# Patient Record
Sex: Female | Born: 1950 | Race: White | Hispanic: No | Marital: Married | Smoking: Never smoker
Health system: Southern US, Community
[De-identification: ages and names within clinical notes are randomized; demographics above are authoritative.]

## PROBLEM LIST (undated history)

## (undated) DIAGNOSIS — I8390 Asymptomatic varicose veins of unspecified lower extremity: Secondary | ICD-10-CM

## (undated) DIAGNOSIS — T8859XA Other complications of anesthesia, initial encounter: Secondary | ICD-10-CM

## (undated) DIAGNOSIS — Z9889 Other specified postprocedural states: Secondary | ICD-10-CM

## (undated) DIAGNOSIS — N3281 Overactive bladder: Secondary | ICD-10-CM

## (undated) DIAGNOSIS — I739 Peripheral vascular disease, unspecified: Secondary | ICD-10-CM

## (undated) DIAGNOSIS — R112 Nausea with vomiting, unspecified: Secondary | ICD-10-CM

## (undated) DIAGNOSIS — R3915 Urgency of urination: Secondary | ICD-10-CM

## (undated) DIAGNOSIS — M1990 Unspecified osteoarthritis, unspecified site: Secondary | ICD-10-CM

## (undated) DIAGNOSIS — K219 Gastro-esophageal reflux disease without esophagitis: Secondary | ICD-10-CM

## (undated) DIAGNOSIS — T4145XA Adverse effect of unspecified anesthetic, initial encounter: Secondary | ICD-10-CM

## (undated) HISTORY — PX: ABDOMINAL HYSTERECTOMY: SHX81

---

## 2003-09-02 HISTORY — PX: COLONOSCOPY: SHX174

## 2014-04-05 ENCOUNTER — Encounter (HOSPITAL_COMMUNITY): Payer: Self-pay | Admitting: Pharmacy Technician

## 2014-04-05 NOTE — Patient Instructions (Addendum)
Kathy Jennings  04/05/2014                           YOUR PROCEDURE IS SCHEDULED ON: 04/18/14               ENTER THRU [facility]IN [facility] ENTRANCE AND                            FOLLOW  SIGNS TO SHORT STAY CENTER                 ARRIVE AT SHORT STAY AT:  7:00 AM               CALL THIS NUMBER IF ANY PROBLEMS THE DAY OF SURGERY :               [phone]                                REMEMBER:   Do not eat food or drink liquids AFTER MIDNIGHT                  Take these medicines the morning of surgery with               A SIPS OF WATER :    OMEPRAZOLE     Do not wear jewelry, make-up   Do not wear lotions, powders, or perfumes.   Do not shave legs or underarms 12 hrs. before surgery (men may shave face)  Do not bring valuables to the hospital.  Contacts, dentures or bridgework may not be worn into surgery.  Leave suitcase in the car. After surgery it may be brought to your room.  For patients admitted to the hospital more than one night, checkout time is            11:00 AM                                                     ________________________________________________________________________                                                                        [facility] - PREPARING FOR SURGERY  Before surgery, you can play an important role.  Because skin is not sterile, your skin needs to be as free of germs as possible.  You can reduce the number of germs on your skin by washing with CHG (chlorahexidine gluconate) soap before surgery.  CHG is an antiseptic cleaner which kills germs and bonds with the skin to continue killing germs even after washing. Please DO NOT use if you have an allergy to CHG or antibacterial soaps.  If your skin becomes reddened/irritated stop using the CHG and inform your nurse when you arrive at Short Stay. Do not shave (including legs and underarms) for at least 48 hours prior to the first CHG shower.  You may shave your  face. Please  follow these instructions carefully:   1.  Shower with CHG Soap the night before surgery and the  morning of Surgery.   2.  If you choose to wash your hair, wash your hair first as usual with your  normal  Shampoo.   3.  After you shampoo, rinse your hair and body thoroughly to remove the  shampoo.                                         4.  Use CHG as you would any other liquid soap.  You can apply chg directly  to the skin and wash . Gently wash with scrungie or clean wascloth    5.  Apply the CHG Soap to your body ONLY FROM THE NECK DOWN.   Do not use on open                           Wound or open sores. Avoid contact with eyes, ears mouth and genitals (private parts).                        Genitals (private parts) with your normal soap.              6.  Wash thoroughly, paying special attention to the area where your surgery  will be performed.   7.  Thoroughly rinse your body with warm water from the neck down.   8.  DO NOT shower/wash with your normal soap after using and rinsing off  the CHG Soap .                9.  Pat yourself dry with a clean towel.             10.  Wear clean pajamas.             11.  Place clean sheets on your bed the night of your first shower and do not  sleep with pets.  Day of Surgery : Do not apply any lotions/deodorants the morning of surgery.  Please wear clean clothes to the hospital/surgery center.  FAILURE TO FOLLOW THESE INSTRUCTIONS MAY RESULT IN THE CANCELLATION OF YOUR SURGERY    PATIENT SIGNATURE_________________________________  ______________________________________________________________________     [name]  An incentive spirometer is a tool that can help keep your lungs clear and active. This tool measures how well you are filling your lungs with each breath. Taking long deep breaths may help reverse or decrease the chance of developing breathing (pulmonary) problems (especially infection)  following:  A long period of time when you are unable to move or be active. BEFORE THE PROCEDURE   If the spirometer includes an indicator to show your best effort, your nurse or respiratory therapist will set it to a desired goal.  If possible, sit up straight or lean slightly forward. Try not to slouch.  Hold the incentive spirometer in an upright position. INSTRUCTIONS FOR USE  1. Sit on the edge of your bed if possible, or sit up as far as you can in bed or on a chair. 2. Hold the incentive spirometer in an upright position. 3. Breathe out normally. 4. Place the mouthpiece in your mouth and seal your lips tightly around it. 5. Breathe in slowly and as deeply as possible, raising the piston  or the ball toward the top of the column. 6. Hold your breath for 3-5 seconds or for as long as possible. Allow the piston or ball to fall to the bottom of the column. 7. Remove the mouthpiece from your mouth and breathe out normally. 8. Rest for a few seconds and repeat Steps 1 through 7 at least 10 times every 1-2 hours when you are awake. Take your time and take a few normal breaths between deep breaths. 9. The spirometer may include an indicator to show your best effort. Use the indicator as a goal to work toward during each repetition. 10. After each set of 10 deep breaths, practice coughing to be sure your lungs are clear. If you have an incision (the cut made at the time of surgery), support your incision when coughing by placing a pillow or rolled up towels firmly against it. Once you are able to get out of bed, walk around indoors and cough well. You may stop using the incentive spirometer when instructed by your caregiver.  RISKS AND COMPLICATIONS  Take your time so you do not get dizzy or light-headed.  If you are in pain, you may need to take or ask for pain medication before doing incentive spirometry. It is harder to take a deep breath if you are having pain. AFTER USE  Rest and  breathe slowly and easily.  It can be helpful to keep track of a log of your progress. Your caregiver can provide you with a simple table to help with this. If you are using the spirometer at home, follow these instructions: SEEK MEDICAL CARE IF:   You are having difficultly using the spirometer.  You have trouble using the spirometer as often as instructed.  Your pain medication is not giving enough relief while using the spirometer.  You develop fever of 100.5 F (38.1 C) or higher. SEEK IMMEDIATE MEDICAL CARE IF:   You cough up bloody sputum that had not been present before.  You develop fever of 102 F (38.9 C) or greater.  You develop worsening pain at or near the incision site. MAKE SURE YOU:   Understand these instructions.  Will watch your condition.  Will get help right away if you are not doing well or get worse. Document Released: 12/29/2006 Document Revised: 11/10/2011 Document Reviewed: 03/01/2007 [employer] Patient Information 2014 [employer], [place].   ________________________________________________________________________  WHAT IS A BLOOD TRANSFUSION? Blood Transfusion Information  A transfusion is the replacement of blood or some of its parts. Blood is made up of multiple cells which provide different functions.  Red blood cells carry oxygen and are used for blood loss replacement.  White blood cells fight against infection.  Platelets control bleeding.  Plasma helps clot blood.  Other blood products are available for specialized needs, such as hemophilia or other clotting disorders. BEFORE THE TRANSFUSION  Who gives blood for transfusions?   Healthy volunteers who are fully evaluated to make sure their blood is safe. This is blood bank blood. Transfusion therapy is the safest it has ever been in the practice of medicine. Before blood is taken from a donor, a complete history is taken to make sure that person has no history of diseases nor engages in  risky social behavior (examples are intravenous drug use or sexual activity with multiple partners). The donor's travel history is screened to minimize risk of transmitting infections, such as malaria. The donated blood is tested for signs of infectious diseases, such as HIV and hepatitis.  The blood is then tested to be sure it is compatible with you in order to minimize the chance of a transfusion reaction. If you or a relative donates blood, this is often done in anticipation of surgery and is not appropriate for emergency situations. It takes many days to process the donated blood. RISKS AND COMPLICATIONS Although transfusion therapy is very safe and saves many lives, the main dangers of transfusion include:   Getting an infectious disease.  Developing a transfusion reaction. This is an allergic reaction to something in the blood you were given. Every precaution is taken to prevent this. The decision to have a blood transfusion has been considered carefully by your caregiver before blood is given. Blood is not given unless the benefits outweigh the risks. AFTER THE TRANSFUSION  Right after receiving a blood transfusion, you will usually feel much better and more energetic. This is especially true if your red blood cells have gotten low (anemic). The transfusion raises the level of the red blood cells which carry oxygen, and this usually causes an energy increase.  The nurse administering the transfusion will monitor you carefully for complications. HOME CARE INSTRUCTIONS  No special instructions are needed after a transfusion. You may find your energy is better. Speak with your caregiver about any limitations on activity for underlying diseases you may have. SEEK MEDICAL CARE IF:   Your condition is not improving after your transfusion.  You develop redness or irritation at the intravenous (IV) site. SEEK IMMEDIATE MEDICAL CARE IF:  Any of the following symptoms occur over the next 12  hours:  Shaking chills.  You have a temperature by mouth above 102 F (38.9 C), not controlled by medicine.  Chest, back, or muscle pain.  People around you feel you are not acting correctly or are confused.  Shortness of breath or difficulty breathing.  Dizziness and fainting.  You get a rash or develop hives.  You have a decrease in urine output.  Your urine turns a dark color or changes to pink, red, or brown. Any of the following symptoms occur over the next 10 days:  You have a temperature by mouth above 102 F (38.9 C), not controlled by medicine.  Shortness of breath.  Weakness after normal activity.  The white part of the eye turns yellow (jaundice).  You have a decrease in the amount of urine or are urinating less often.  Your urine turns a dark color or changes to pink, red, or brown. Document Released: 08/15/2000 Document Revised: 11/10/2011 Document Reviewed: 04/03/2008 [facility] Patient Information 2014 [place], [place].  _______________________________________________________________________

## 2014-04-06 ENCOUNTER — Encounter (HOSPITAL_COMMUNITY): Payer: Self-pay

## 2014-04-06 ENCOUNTER — Encounter (INDEPENDENT_AMBULATORY_CARE_PROVIDER_SITE_OTHER): Payer: Self-pay

## 2014-04-06 ENCOUNTER — Encounter (HOSPITAL_COMMUNITY)
Admission: RE | Admit: 2014-04-06 | Discharge: 2014-04-06 | Disposition: A | Discharge status: Home / Self Care | Payer: BC Managed Care – PPO | Source: Ambulatory Visit | Attending: Orthopedic Surgery | Admitting: Orthopedic Surgery

## 2014-04-06 DIAGNOSIS — Z01818 Encounter for other preprocedural examination: Secondary | ICD-10-CM | POA: Insufficient documentation

## 2014-04-06 DIAGNOSIS — Z01812 Encounter for preprocedural laboratory examination: Secondary | ICD-10-CM | POA: Insufficient documentation

## 2014-04-06 HISTORY — DX: Gastro-esophageal reflux disease without esophagitis: K21.9

## 2014-04-06 HISTORY — DX: Overactive bladder: N32.81

## 2014-04-06 HISTORY — DX: Asymptomatic varicose veins of unspecified lower extremity: I83.90

## 2014-04-06 HISTORY — DX: Adverse effect of unspecified anesthetic, initial encounter: T41.45XA

## 2014-04-06 HISTORY — DX: Urgency of urination: R39.15

## 2014-04-06 HISTORY — DX: Other complications of anesthesia, initial encounter: T88.59XA

## 2014-04-06 HISTORY — DX: Unspecified osteoarthritis, unspecified site: M19.90

## 2014-04-06 LAB — URINALYSIS, ROUTINE W REFLEX MICROSCOPIC
Bilirubin Urine: NEGATIVE
GLUCOSE, UA: NEGATIVE mg/dL
Ketones, ur: NEGATIVE mg/dL
LEUKOCYTES UA: NEGATIVE
NITRITE: NEGATIVE
PROTEIN: NEGATIVE mg/dL
Specific Gravity, Urine: 1.029 (ref 1.005–1.030)
Urobilinogen, UA: 1 mg/dL (ref 0.0–1.0)
pH: 6 (ref 5.0–8.0)

## 2014-04-06 LAB — CBC
HEMATOCRIT: 36.1 % (ref 36.0–46.0)
HEMOGLOBIN: 11.7 g/dL — AB (ref 12.0–15.0)
MCH: 27.6 pg (ref 26.0–34.0)
MCHC: 32.4 g/dL (ref 30.0–36.0)
MCV: 85.1 fL (ref 78.0–100.0)
Platelets: 210 10*3/uL (ref 150–400)
RBC: 4.24 MIL/uL (ref 3.87–5.11)
RDW: 15.9 % — ABNORMAL HIGH (ref 11.5–15.5)
WBC: 3.3 10*3/uL — ABNORMAL LOW (ref 4.0–10.5)

## 2014-04-06 LAB — SURGICAL PCR SCREEN
MRSA, PCR: NEGATIVE
STAPHYLOCOCCUS AUREUS: POSITIVE — AB

## 2014-04-06 LAB — PROTIME-INR
INR: 1.02 (ref 0.00–1.49)
Prothrombin Time: 13.4 seconds (ref 11.6–15.2)

## 2014-04-06 LAB — APTT: aPTT: 33 seconds (ref 24–37)

## 2014-04-06 LAB — BASIC METABOLIC PANEL
Anion gap: 11 (ref 5–15)
BUN: 30 mg/dL — AB (ref 6–23)
CHLORIDE: 105 meq/L (ref 96–112)
CO2: 25 meq/L (ref 19–32)
Calcium: 9.6 mg/dL (ref 8.4–10.5)
Creatinine, Ser: 0.59 mg/dL (ref 0.50–1.10)
GFR calc Af Amer: >90 mL/min (ref >90)
GFR calc non Af Amer: >90 mL/min (ref >90)
Glucose, Bld: 74 mg/dL (ref 70–99)
Potassium: 4 mEq/L (ref 3.7–5.3)
Sodium: 141 mEq/L (ref 137–147)

## 2014-04-06 LAB — URINE MICROSCOPIC-ADD ON

## 2014-04-06 NOTE — Progress Notes (Signed)
Abnormal UA faxed to Dr. Charlann BoxerLin

## 2014-04-06 NOTE — H&P (Signed)
TOTAL HIP ADMISSION H&P  Patient is admitted for right total hip arthroplasty, anterior approach.  Subjective:  Chief Complaint:   Right hip OA / pain  HPI: Kathy Jennings, 63 y.o. female, has a history of pain and functional disability in the right hip(s) due to arthritis and patient has failed non-surgical conservative treatments for greater than 12 weeks to include NSAID's and/or analgesics and activity modification.  Onset of symptoms was gradual starting 5+ years ago with gradually worsening course since that time.The patient noted no past surgery on the right hip(s).  Patient currently rates pain in the right hip at 5 out of 10 with activity. Patient has night pain, worsening of pain with activity and weight bearing, trendelenberg gait, pain that interfers with activities of daily living and pain with passive range of motion. Patient has evidence of periarticular osteophytes and joint space narrowing by imaging studies. This condition presents safety issues increasing the risk of falls.  There is no current active infection.  Risks, benefits and expectations were discussed with the patient.  Risks including but not limited to the risk of anesthesia, blood clots, nerve damage, blood vessel damage, failure of the prosthesis, infection and up to and including death.  Patient understand the risks, benefits and expectations and wishes to proceed with surgery.   PCP: Estanislado PandySASSER,PAUL W, MD  D/C Plans:      Home with HHPT  Post-op Meds:       No Rx given  Tranexamic Acid:      To be given - IV    Decadron:      Is to be given  FYI:     ASA post-op  Norco post-op     Past Medical History  Diagnosis Date  . Complication of anesthesia     slow to wake up  . Arthritis   . GERD (gastroesophageal reflux disease)   . OAB (overactive bladder)   . Urgency of urination   . Varicose veins     Past Surgical History  Procedure Laterality Date  . Abdominal hysterectomy  30 YRS AGO  . Colonoscopy   2005    No prescriptions prior to admission   Allergies  Allergen Reactions  . Sulfa Antibiotics Nausea And Vomiting    History  Substance Use Topics  . Smoking status: Never Smoker   . Smokeless tobacco: Not on file  . Alcohol Use: No      Review of Systems  Constitutional: Negative.   HENT: Negative.   Eyes: Negative.   Respiratory: Negative.   Cardiovascular: Negative.   Gastrointestinal: Positive for heartburn.  Genitourinary: Positive for frequency.  Musculoskeletal: Positive for joint pain.  Skin: Negative.   Neurological: Negative.   Endo/Heme/Allergies: Negative.   Psychiatric/Behavioral: Negative.     Objective:  Physical Exam  Constitutional: She is oriented to person, place, and time. She appears well-developed and well-nourished.  HENT:  Head: Normocephalic and atraumatic.  Mouth/Throat: Oropharynx is clear and moist.  Eyes: Pupils are equal, round, and reactive to light.  Neck: Neck supple. No JVD present. No tracheal deviation present. No thyromegaly present.  Cardiovascular: Normal rate, regular rhythm, normal heart sounds and intact distal pulses.   Respiratory: Effort normal and breath sounds normal. No stridor. No respiratory distress. She has no wheezes.  GI: Soft. There is no tenderness. There is no guarding.  Musculoskeletal:       Right hip: She exhibits decreased range of motion, decreased strength, tenderness and bony tenderness. She exhibits no  swelling, no deformity and no laceration.  Lymphadenopathy:    She has no cervical adenopathy.  Neurological: She is alert and oriented to person, place, and time.  Skin: Skin is warm and dry.  Psychiatric: She has a normal mood and affect.    Vital signs in last 24 hours: Temp:  [96.1 F (35.6 C)] 96.1 F (35.6 C) (08/06 1253) Pulse Rate:  [60] 60 (08/06 1253) Resp:  [16] 16 (08/06 1253) BP: (138)/(79) 138/79 mmHg (08/06 1253) SpO2:  [97 %] 97 % (08/06 1253) Weight:  [89.812 kg (198 lb)]  89.812 kg (198 lb) (08/06 1253)   Imaging Review Plain radiographs demonstrate severe degenerative joint disease of the right hip(s). The bone quality appears to be good for age and reported activity level.  Assessment/Plan:  End stage arthritis, right hip(s)  The patient history, physical examination, clinical judgement of the provider and imaging studies are consistent with end stage degenerative joint disease of the right hip(s) and total hip arthroplasty is deemed medically necessary. The treatment options including medical management, injection therapy, arthroscopy and arthroplasty were discussed at length. The risks and benefits of total hip arthroplasty were presented and reviewed. The risks due to aseptic loosening, infection, stiffness, dislocation/subluxation,  thromboembolic complications and other imponderables were discussed.  The patient acknowledged the explanation, agreed to proceed with the plan and consent was signed. Patient is being admitted for inpatient treatment for surgery, pain control, PT, OT, prophylactic antibiotics, VTE prophylaxis, progressive ambulation and ADL's and discharge planning.The patient is planning to be discharged home with home health services.     Anastasio Auerbach Jacqualynn Parco   PA-C  04/06/2014, 4:56 PM

## 2014-04-11 ENCOUNTER — Other Ambulatory Visit (HOSPITAL_COMMUNITY): Payer: Self-pay

## 2014-04-18 ENCOUNTER — Inpatient Hospital Stay (HOSPITAL_COMMUNITY)
Admission: RE | Admit: 2014-04-18 | Discharge: 2014-04-19 | DRG: 470 | Disposition: A | Discharge status: Home Health | Payer: BC Managed Care – PPO | Source: Ambulatory Visit | Attending: Orthopedic Surgery | Admitting: Orthopedic Surgery

## 2014-04-18 ENCOUNTER — Inpatient Hospital Stay (HOSPITAL_COMMUNITY): Payer: BC Managed Care – PPO

## 2014-04-18 ENCOUNTER — Encounter (HOSPITAL_COMMUNITY): Payer: BC Managed Care – PPO | Admitting: Anesthesiology

## 2014-04-18 ENCOUNTER — Encounter (HOSPITAL_COMMUNITY): Payer: Self-pay | Admitting: *Deleted

## 2014-04-18 ENCOUNTER — Encounter (HOSPITAL_COMMUNITY)
Admission: RE | Disposition: A | Discharge status: Home Health | Payer: Self-pay | Source: Ambulatory Visit | Attending: Orthopedic Surgery

## 2014-04-18 ENCOUNTER — Inpatient Hospital Stay (HOSPITAL_COMMUNITY): Payer: BC Managed Care – PPO | Admitting: Anesthesiology

## 2014-04-18 DIAGNOSIS — Z6833 Body mass index (BMI) 33.0-33.9, adult: Secondary | ICD-10-CM

## 2014-04-18 DIAGNOSIS — M25559 Pain in unspecified hip: Secondary | ICD-10-CM | POA: Diagnosis present

## 2014-04-18 DIAGNOSIS — Z96649 Presence of unspecified artificial hip joint: Secondary | ICD-10-CM

## 2014-04-18 DIAGNOSIS — M1610 Unilateral primary osteoarthritis, unspecified hip: Secondary | ICD-10-CM | POA: Diagnosis present

## 2014-04-18 DIAGNOSIS — M169 Osteoarthritis of hip, unspecified: Principal | ICD-10-CM | POA: Diagnosis present

## 2014-04-18 DIAGNOSIS — D62 Acute posthemorrhagic anemia: Secondary | ICD-10-CM | POA: Diagnosis not present

## 2014-04-18 DIAGNOSIS — K219 Gastro-esophageal reflux disease without esophagitis: Secondary | ICD-10-CM | POA: Diagnosis present

## 2014-04-18 DIAGNOSIS — E669 Obesity, unspecified: Secondary | ICD-10-CM | POA: Diagnosis present

## 2014-04-18 DIAGNOSIS — Z01812 Encounter for preprocedural laboratory examination: Secondary | ICD-10-CM | POA: Diagnosis not present

## 2014-04-18 HISTORY — PX: TOTAL HIP ARTHROPLASTY: SHX124

## 2014-04-18 LAB — TYPE AND SCREEN
ABO/RH(D): A POS
Antibody Screen: NEGATIVE

## 2014-04-18 LAB — ABO/RH: ABO/RH(D): A POS

## 2014-04-18 SURGERY — ARTHROPLASTY, HIP, TOTAL, ANTERIOR APPROACH
Anesthesia: Spinal | Site: Hip | Laterality: Right

## 2014-04-18 MED ORDER — ONDANSETRON HCL 4 MG/2ML IJ SOLN
INTRAMUSCULAR | Status: DC | PRN
  Administered 2014-04-18: 4 mg via INTRAVENOUS

## 2014-04-18 MED ORDER — PHENOL 1.4 % MT LIQD
1.0000 | OROMUCOSAL | Status: DC | PRN
  Filled 2014-04-18: qty 177

## 2014-04-18 MED ORDER — FERROUS SULFATE 325 (65 FE) MG PO TABS
325.0000 mg | ORAL_TABLET | Freq: Three times a day (TID) | ORAL | Status: DC
  Administered 2014-04-19 (×2): 325 mg via ORAL
  Filled 2014-04-18 (×4): qty 1

## 2014-04-18 MED ORDER — MAGNESIUM CITRATE PO SOLN: 1.0000 | Freq: Once | ORAL | Status: AC | PRN

## 2014-04-18 MED ORDER — DEXAMETHASONE SODIUM PHOSPHATE 10 MG/ML IJ SOLN
INTRAMUSCULAR | Status: AC
  Filled 2014-04-18: qty 1

## 2014-04-18 MED ORDER — ONDANSETRON HCL 4 MG/2ML IJ SOLN
INTRAMUSCULAR | Status: AC
  Filled 2014-04-18: qty 2

## 2014-04-18 MED ORDER — ONDANSETRON HCL 4 MG PO TABS: 4.0000 mg | ORAL_TABLET | Freq: Four times a day (QID) | ORAL | Status: DC | PRN

## 2014-04-18 MED ORDER — MEPERIDINE HCL 50 MG/ML IJ SOLN: 6.2500 mg | INTRAMUSCULAR | Status: DC | PRN

## 2014-04-18 MED ORDER — POTASSIUM CHLORIDE 2 MEQ/ML IV SOLN
100.0000 mL/h | INTRAVENOUS | Status: DC
  Administered 2014-04-18 – 2014-04-19 (×2): 100 mL/h via INTRAVENOUS
  Filled 2014-04-18 (×9): qty 1000

## 2014-04-18 MED ORDER — METOCLOPRAMIDE HCL 10 MG PO TABS: 5.0000 mg | ORAL_TABLET | Freq: Three times a day (TID) | ORAL | Status: DC | PRN

## 2014-04-18 MED ORDER — DIPHENHYDRAMINE HCL 25 MG PO CAPS: 25.0000 mg | ORAL_CAPSULE | Freq: Four times a day (QID) | ORAL | Status: DC | PRN

## 2014-04-18 MED ORDER — DEXAMETHASONE SODIUM PHOSPHATE 10 MG/ML IJ SOLN
10.0000 mg | Freq: Once | INTRAMUSCULAR | Status: AC
  Administered 2014-04-18: 10 mg via INTRAVENOUS

## 2014-04-18 MED ORDER — PROMETHAZINE HCL 25 MG/ML IJ SOLN: 6.2500 mg | INTRAMUSCULAR | Status: DC | PRN

## 2014-04-18 MED ORDER — PROPOFOL INFUSION 10 MG/ML OPTIME
INTRAVENOUS | Status: DC | PRN
  Administered 2014-04-18: 75 ug/kg/min via INTRAVENOUS

## 2014-04-18 MED ORDER — PANTOPRAZOLE SODIUM 40 MG PO TBEC
40.0000 mg | DELAYED_RELEASE_TABLET | Freq: Every day | ORAL | Status: DC
  Administered 2014-04-19: 40 mg via ORAL
  Filled 2014-04-18: qty 1

## 2014-04-18 MED ORDER — FENTANYL CITRATE 0.05 MG/ML IJ SOLN
INTRAMUSCULAR | Status: AC
  Filled 2014-04-18: qty 2

## 2014-04-18 MED ORDER — HYDROMORPHONE HCL PF 1 MG/ML IJ SOLN
0.5000 mg | INTRAMUSCULAR | Status: DC | PRN
  Administered 2014-04-18: 0.5 mg via INTRAVENOUS
  Filled 2014-04-18: qty 1

## 2014-04-18 MED ORDER — CEFAZOLIN SODIUM-DEXTROSE 2-3 GM-% IV SOLR
2.0000 g | INTRAVENOUS | Status: AC
  Administered 2014-04-18: 2 g via INTRAVENOUS

## 2014-04-18 MED ORDER — CEFAZOLIN SODIUM-DEXTROSE 2-3 GM-% IV SOLR
2.0000 g | Freq: Four times a day (QID) | INTRAVENOUS | Status: AC
  Administered 2014-04-18 (×2): 2 g via INTRAVENOUS
  Filled 2014-04-18 (×2): qty 50

## 2014-04-18 MED ORDER — TRANEXAMIC ACID 100 MG/ML IV SOLN
1000.0000 mg | Freq: Once | INTRAVENOUS | Status: AC
  Administered 2014-04-18: 1000 mg via INTRAVENOUS
  Filled 2014-04-18: qty 10

## 2014-04-18 MED ORDER — ONDANSETRON HCL 4 MG/2ML IJ SOLN: 4.0000 mg | Freq: Four times a day (QID) | INTRAMUSCULAR | Status: DC | PRN

## 2014-04-18 MED ORDER — SODIUM CHLORIDE 0.9 % IR SOLN
Status: DC | PRN
  Administered 2014-04-18: 1000 mL

## 2014-04-18 MED ORDER — METOCLOPRAMIDE HCL 5 MG/ML IJ SOLN: 5.0000 mg | Freq: Three times a day (TID) | INTRAMUSCULAR | Status: DC | PRN

## 2014-04-18 MED ORDER — CHLORHEXIDINE GLUCONATE 4 % EX LIQD: 60.0000 mL | Freq: Once | CUTANEOUS | Status: DC

## 2014-04-18 MED ORDER — OXYCODONE HCL 5 MG/5ML PO SOLN: 5.0000 mg | Freq: Once | ORAL | Status: DC | PRN

## 2014-04-18 MED ORDER — HYDROMORPHONE HCL PF 1 MG/ML IJ SOLN: 0.2500 mg | INTRAMUSCULAR | Status: DC | PRN

## 2014-04-18 MED ORDER — METHOCARBAMOL 1000 MG/10ML IJ SOLN
500.0000 mg | Freq: Four times a day (QID) | INTRAVENOUS | Status: DC | PRN
  Administered 2014-04-18: 500 mg via INTRAVENOUS
  Filled 2014-04-18 (×2): qty 5

## 2014-04-18 MED ORDER — CEFAZOLIN SODIUM-DEXTROSE 2-3 GM-% IV SOLR
INTRAVENOUS | Status: AC
  Filled 2014-04-18: qty 50

## 2014-04-18 MED ORDER — MENTHOL 3 MG MT LOZG
1.0000 | LOZENGE | OROMUCOSAL | Status: DC | PRN
  Filled 2014-04-18: qty 9

## 2014-04-18 MED ORDER — DOCUSATE SODIUM 100 MG PO CAPS
100.0000 mg | ORAL_CAPSULE | Freq: Two times a day (BID) | ORAL | Status: DC
  Administered 2014-04-18 – 2014-04-19 (×2): 100 mg via ORAL

## 2014-04-18 MED ORDER — HYDROCODONE-ACETAMINOPHEN 7.5-325 MG PO TABS
1.0000 | ORAL_TABLET | ORAL | Status: DC
  Administered 2014-04-18: 2 via ORAL
  Administered 2014-04-18: 1 via ORAL
  Administered 2014-04-18 – 2014-04-19 (×2): 2 via ORAL
  Filled 2014-04-18: qty 2
  Filled 2014-04-18: qty 1
  Filled 2014-04-18 (×3): qty 2

## 2014-04-18 MED ORDER — PROPOFOL 10 MG/ML IV BOLUS
INTRAVENOUS | Status: AC
  Filled 2014-04-18: qty 20

## 2014-04-18 MED ORDER — OXYCODONE HCL 5 MG PO TABS: 5.0000 mg | ORAL_TABLET | Freq: Once | ORAL | Status: DC | PRN

## 2014-04-18 MED ORDER — FENTANYL CITRATE 0.05 MG/ML IJ SOLN
INTRAMUSCULAR | Status: DC | PRN
  Administered 2014-04-18 (×2): 50 ug via INTRAVENOUS

## 2014-04-18 MED ORDER — ASPIRIN EC 325 MG PO TBEC
325.0000 mg | DELAYED_RELEASE_TABLET | Freq: Two times a day (BID) | ORAL | Status: DC
  Administered 2014-04-19: 325 mg via ORAL
  Filled 2014-04-18 (×3): qty 1

## 2014-04-18 MED ORDER — BISACODYL 10 MG RE SUPP: 10.0000 mg | Freq: Every day | RECTAL | Status: DC | PRN

## 2014-04-18 MED ORDER — POLYETHYLENE GLYCOL 3350 17 G PO PACK
17.0000 g | PACK | Freq: Two times a day (BID) | ORAL | Status: DC
  Administered 2014-04-18 – 2014-04-19 (×2): 17 g via ORAL

## 2014-04-18 MED ORDER — LACTATED RINGERS IV SOLN
INTRAVENOUS | Status: DC | PRN
  Administered 2014-04-18 (×3): via INTRAVENOUS

## 2014-04-18 MED ORDER — MIDAZOLAM HCL 5 MG/5ML IJ SOLN
INTRAMUSCULAR | Status: DC | PRN
  Administered 2014-04-18 (×2): 1 mg via INTRAVENOUS

## 2014-04-18 MED ORDER — BUPIVACAINE HCL (PF) 0.5 % IJ SOLN
INTRAMUSCULAR | Status: AC
  Filled 2014-04-18: qty 30

## 2014-04-18 MED ORDER — DEXAMETHASONE SODIUM PHOSPHATE 10 MG/ML IJ SOLN
10.0000 mg | Freq: Once | INTRAMUSCULAR | Status: AC
  Administered 2014-04-19: 10 mg via INTRAVENOUS
  Filled 2014-04-18: qty 1

## 2014-04-18 MED ORDER — METHOCARBAMOL 500 MG PO TABS
500.0000 mg | ORAL_TABLET | Freq: Four times a day (QID) | ORAL | Status: DC | PRN
  Administered 2014-04-18 – 2014-04-19 (×2): 500 mg via ORAL
  Filled 2014-04-18 (×2): qty 1

## 2014-04-18 MED ORDER — ALUM & MAG HYDROXIDE-SIMETH 200-200-20 MG/5ML PO SUSP: 30.0000 mL | ORAL | Status: DC | PRN

## 2014-04-18 MED ORDER — MIDAZOLAM HCL 2 MG/2ML IJ SOLN
INTRAMUSCULAR | Status: AC
  Filled 2014-04-18: qty 2

## 2014-04-18 SURGICAL SUPPLY — 37 items
BAG ZIPLOCK 12X15 (MISCELLANEOUS) IMPLANT
CAPT HIP PF COP ×2 IMPLANT
COVER PERINEAL POST (MISCELLANEOUS) ×2 IMPLANT
DERMABOND ADVANCED (GAUZE/BANDAGES/DRESSINGS) ×1
DERMABOND ADVANCED .7 DNX12 (GAUZE/BANDAGES/DRESSINGS) ×1 IMPLANT
DRAPE C-ARM 42X120 X-RAY (DRAPES) ×2 IMPLANT
DRAPE STERI IOBAN 125X83 (DRAPES) ×2 IMPLANT
DRAPE U-SHAPE 47X51 STRL (DRAPES) ×6 IMPLANT
DRSG AQUACEL AG ADV 3.5X10 (GAUZE/BANDAGES/DRESSINGS) ×2 IMPLANT
DURAPREP 26ML APPLICATOR (WOUND CARE) ×2 IMPLANT
ELECT BLADE TIP CTD 4 INCH (ELECTRODE) ×2 IMPLANT
ELECT PENCIL ROCKER SW 15FT (MISCELLANEOUS) IMPLANT
ELECT REM PT RETURN 15FT ADLT (MISCELLANEOUS) IMPLANT
ELECT REM PT RETURN 9FT ADLT (ELECTROSURGICAL) ×2
ELECTRODE REM PT RTRN 9FT ADLT (ELECTROSURGICAL) ×1 IMPLANT
FACESHIELD WRAPAROUND (MASK) ×8 IMPLANT
GLOVE BIOGEL PI IND STRL 7.5 (GLOVE) ×1 IMPLANT
GLOVE BIOGEL PI IND STRL 8 (GLOVE) ×1 IMPLANT
GLOVE BIOGEL PI INDICATOR 7.5 (GLOVE) ×1
GLOVE BIOGEL PI INDICATOR 8 (GLOVE) ×1
GLOVE ECLIPSE 8.0 STRL XLNG CF (GLOVE) ×2 IMPLANT
GLOVE ORTHO TXT STRL SZ7.5 (GLOVE) ×4 IMPLANT
GOWN SPEC L3 XXLG W/TWL (GOWN DISPOSABLE) ×2 IMPLANT
GOWN STRL REUS W/TWL LRG LVL3 (GOWN DISPOSABLE) ×2 IMPLANT
HOLDER FOLEY CATH W/STRAP (MISCELLANEOUS) ×2 IMPLANT
KIT BASIN OR (CUSTOM PROCEDURE TRAY) ×2 IMPLANT
PACK TOTAL JOINT (CUSTOM PROCEDURE TRAY) ×2 IMPLANT
SAW OSC TIP CART 19.5X105X1.3 (SAW) ×2 IMPLANT
SUT MNCRL AB 4-0 PS2 18 (SUTURE) ×2 IMPLANT
SUT VIC AB 1 CT1 36 (SUTURE) ×6 IMPLANT
SUT VIC AB 2-0 CT1 27 (SUTURE) ×2
SUT VIC AB 2-0 CT1 TAPERPNT 27 (SUTURE) ×2 IMPLANT
SUT VLOC 180 0 24IN GS25 (SUTURE) ×2 IMPLANT
TOWEL OR 17X26 10 PK STRL BLUE (TOWEL DISPOSABLE) ×2 IMPLANT
TOWEL OR NON WOVEN STRL DISP B (DISPOSABLE) IMPLANT
TRAY FOLEY CATH 14FRSI W/METER (CATHETERS) ×2 IMPLANT
WATER STERILE IRR 1500ML POUR (IV SOLUTION) ×2 IMPLANT

## 2014-04-18 NOTE — Interval H&P Note (Signed)
History and Physical Interval Note:  04/18/2014 8:49 AM  Kathy Jennings  has presented today for surgery, with the diagnosis of RIGHT HIP OA  The various methods of treatment have been discussed with the patient and family. After consideration of risks, benefits and other options for treatment, the patient has consented to  Procedure(s): RIGHT TOTAL HIP ARTHROPLASTY ANTERIOR APPROACH (Right) as a surgical intervention .  The patient's history has been reviewed, patient examined, no change in status, stable for surgery.  I have reviewed the patient's chart and labs.  Questions were answered to the patient's satisfaction.     Shelda PalLIN,Tiarna Koppen D

## 2014-04-18 NOTE — Plan of Care (Signed)
Problem: Consults Goal: Diagnosis- Total Joint Replacement Primary Total Hip     

## 2014-04-18 NOTE — Anesthesia Postprocedure Evaluation (Signed)
Anesthesia Post Note  Patient: Kathy Jennings  Procedure(s) Performed: Procedure(s) (LRB): RIGHT TOTAL HIP ARTHROPLASTY ANTERIOR APPROACH (Right)  Anesthesia type: Spinal  Patient location: PACU  Post pain: Pain level controlled  Post assessment: Post-op Vital signs reviewed  Last Vitals: BP 122/58  Pulse 42  Temp(Src) 36.1 C (Oral)  Resp 17  SpO2 100%  Post vital signs: Reviewed  Level of consciousness: sedated  Complications: No apparent anesthesia complications

## 2014-04-18 NOTE — Anesthesia Procedure Notes (Signed)
Spinal  Patient location during procedure: OR Staffing Anesthesiologist: Browning [place] R Performed by: anesthesiologist  Preanesthetic Checklist Completed: patient identified, site marked, surgical consent, pre-op evaluation, timeout performed, IV checked, risks and benefits discussed and monitors and equipment checked Spinal Block Patient position: sitting Prep: Betadine Patient monitoring: heart rate, continuous pulse ox and blood pressure Approach: left paramedian Location: L3-4 Injection technique: single-shot Needle Needle type: Sprotte  Needle gauge: 24 G Needle length: 9 cm Assessment Sensory level: T8 Additional Notes Expiration date of kit checked and confirmed. Patient tolerated procedure well, without complications.     

## 2014-04-18 NOTE — Transfer of Care (Signed)
Immediate Anesthesia Transfer of Care Note  Patient: Kathy Jennings  Procedure(s) Performed: Procedure(s): RIGHT TOTAL HIP ARTHROPLASTY ANTERIOR APPROACH (Right)  Patient Location: PACU  Anesthesia Type:Spinal  Level of Consciousness: awake, alert , oriented and patient cooperative  Airway & Oxygen Therapy: Patient Spontanous Breathing and Patient connected to face mask oxygen  Post-op Assessment: Report given to PACU RN and Post -op Vital signs reviewed and stable  Post vital signs: Reviewed and stable  Complications: No apparent anesthesia complications

## 2014-04-18 NOTE — Op Note (Signed)
NAME:  Kathy Jennings                ACCOUNT NO.: [id number]      MEDICAL RECORD NO.: [id number]      [facility]      PHYSICIAN:  Durene RomansLIN,Loyce Klasen D  DATE OF BIRTH:  1950/12/18     DATE OF PROCEDURE:  04/18/2014                                 OPERATIVE REPORT         PREOPERATIVE DIAGNOSIS: Right  hip osteoarthritis.      POSTOPERATIVE DIAGNOSIS:  Right hip osteoarthritis.      PROCEDURE:  Right total hip replacement through an anterior approach   utilizing DePuy THR system, component size 50mm pinnacle cup, a size 32+4 neutral   Altrex liner, a size 2 Standard Tri Lock stem with a 32+1 delta ceramic   ball.      SURGEON:  Madlyn FrankelMatthew D. Charlann Boxerlin, M.D.      ASSISTANT:  Lanney GinsMatthew Babish, PA-C     ANESTHESIA:  Spinal.      SPECIMENS:  None.      COMPLICATIONS:  None.      BLOOD LOSS:  200 cc     DRAINS:  None.      INDICATION OF THE PROCEDURE:  Kathy Jennings is a 63 y.o. female who had   presented to office for evaluation of right hip pain.  Radiographs revealed   progressive degenerative changes with bone-on-bone   articulation to the  hip joint.  The patient had painful limited range of   motion significantly affecting their overall quality of life.  The patient was failing to    respond to conservative measures, and at this point was ready   to proceed with more definitive measures.  The patient has noted progressive   degenerative changes in his hip, progressive problems and dysfunction   with regarding the hip prior to surgery.  Consent was obtained for   benefit of pain relief.  Specific risk of infection, DVT, component   failure, dislocation, need for revision surgery, as well discussion of   the anterior versus posterior approach were reviewed.  Consent was   obtained for benefit of anterior pain relief through an anterior   approach.      PROCEDURE IN DETAIL:  The patient was brought to operative theater.   Once adequate anesthesia,  preoperative antibiotics, 2gm of Ancef administered.   The patient was positioned supine on the OSI Hanna table.  Once adequate   padding of boney process was carried out, we had predraped out the hip, and  used fluoroscopy to confirm orientation of the pelvis and position.      The right hip was then prepped and draped from proximal iliac crest to   mid thigh with shower curtain technique.      Time-out was performed identifying the patient, planned procedure, and   extremity.     An incision was then made 2 cm distal and lateral to the   anterior superior iliac spine extending over the orientation of the   tensor fascia lata muscle and sharp dissection was carried down to the   fascia of the muscle and protractor placed in the soft tissues.      The fascia was then incised.  The muscle belly was identified and swept  laterally and retractor placed along the superior neck.  Following   cauterization of the circumflex vessels and removing some pericapsular   fat, a second cobra retractor was placed on the inferior neck.  A third   retractor was placed on the anterior acetabulum after elevating the   anterior rectus.  A L-capsulotomy was along the line of the   superior neck to the trochanteric fossa, then extended proximally and   distally.  Tag sutures were placed and the retractors were then placed   intracapsular.  We then identified the trochanteric fossa and   orientation of my neck cut, confirmed this radiographically   and then made a neck osteotomy with the femur on traction.  The femoral   head was removed without difficulty or complication.  Traction was let   off and retractors were placed posterior and anterior around the   acetabulum.      The labrum and foveal tissue were debrided.  I began reaming with a 47mm   reamer and reamed up to 49mm reamer with good bony bed preparation and a 50mm   cup was chosen.  The final 50mm Pinnacle cup was then impacted under  fluoroscopy  to confirm the depth of penetration and orientation with respect to   abduction.  A screw was placed followed by the hole eliminator.  The final   32+4 neutral Altrex liner was impacted with good visualized rim fit.  The cup was positioned anatomically within the acetabular portion of the pelvis.      At this point, the femur was rolled at 80 degrees.  Further capsule was   released off the inferior aspect of the femoral neck.  I then   released the superior capsule proximally.  The hook was placed laterally   along the femur and elevated manually and held in position with the bed   hook.  The leg was then extended and adducted with the leg rolled to 100   degrees of external rotation.  Once the proximal femur was fully   exposed, I used a box osteotome to set orientation.  I then began   broaching with the starting chili pepper broach and passed this by hand and then broached up to 2.  With the 2 broach in place I chose a standard neck and did a trial reduction.  The offset was appropriate, leg lengths   appeared to be equal, confirmed radiographically.   Given these findings, I went ahead and dislocated the hip, repositioned all   retractors and positioned the right hip in the extended and abducted position.  The final 2 Standard Tri Lock stem was   chosen and it was impacted down to the level of neck cut.  Based on this   and the trial reduction, a 32+1 delta ceramic ball was chosen and   impacted onto a clean and dry trunnion, and the hip was reduced.  The   hip had been irrigated throughout the case again at this point.  I did   reapproximate the superior capsular leaflet to the anterior leaflet   using #1 Vicryl.  The fascia of the   tensor fascia lata muscle was then reapproximated using #1 Vicryl and #0 V-lock sutures.  The   remaining wound was closed with 2-0 Vicryl and running 4-0 Monocryl.   The hip was cleaned, dried, and dressed sterilely using Dermabond and    Aquacel dressing.  She was then brought   to recovery room in  stable condition tolerating the procedure well.    Lanney Gins, PA-C was present for the entirety of the case involved from   preoperative positioning, perioperative retractor management, general   facilitation of the case, as well as primary wound closure as assistant.            Madlyn Frankel Charlann Boxer, M.D.        04/18/2014 11:39 AM

## 2014-04-18 NOTE — Evaluation (Signed)
Physical Therapy Evaluation Patient Details Name: Kathy BetterKathy A Jennings MRN: [id number] DOB: 25-Jun-1951 Today's Date: 04/18/2014   History of Present Illness  [name]  Clinical Impression  Patient's R leg wasnot fully "awake" so  Limited mobility. Pt should progress very well. Pt will benefit from PT to address problems listed in note.    Follow Up Recommendations Home health PT;Supervision/Assistance - 24 hour    Equipment Recommendations  Rolling walker with 5" wheels    Recommendations for Other Services       Precautions / Restrictions Precautions Precautions: Fall Restrictions Weight Bearing Restrictions: No      Mobility  Bed Mobility Overal bed mobility: Needs Assistance Bed Mobility: Supine to Sit     Supine to sit: Min assist     General bed mobility comments: pt able to move self to edge of bed, cues on technique, support r leg  Transfers Overall transfer level: Needs assistance Equipment used: Rolling walker (2 wheeled) Transfers: Stand Pivot Transfers Sit to Stand: +2 safety/equipment;Mod assist         General transfer comment: Noted R leg not fully "awake", support at thigh and knee on R as patient took small steps to recliner.  Ambulation/Gait                Stairs            Wheelchair Mobility    Modified Rankin (Stroke Patients Only)       Balance                                             Pertinent Vitals/Pain Pain Assessment: 0-10 Pain Score: 2  Pain Location: R thigh,  Pain Descriptors / Indicators: Aching;Tightness    Home Living Family/patient expects to be discharged to:: Private residence Living Arrangements: Spouse/significant other Available Help at Discharge: Family Type of Home: House Home Access: Stairs to enter Entrance Stairs-Rails: None   Home Layout: One level Home Equipment: None      Prior Function Level of Independence: Independent               Hand Dominance       Extremity/Trunk Assessment   Upper Extremity Assessment: Overall WFL for tasks assessed           Lower Extremity Assessment: RLE deficits/detail RLE Deficits / Details: R leg is still somewhat numb and did not fully take weight so did not attempt ambulation, just pivoted to recliner.       Communication   Communication: No difficulties  Cognition Arousal/Alertness: Awake/alert Behavior During Therapy: WFL for tasks assessed/performed Overall Cognitive Status: Within Functional Limits for tasks assessed                      General Comments      Exercises Total Joint Exercises Ankle Circles/Pumps: AROM;Both;10 reps;Supine Heel Slides: AAROM;Right;10 reps;Supine Hip ABduction/ADduction: AAROM;Right;10 reps;Supine      Assessment/Plan    PT Assessment Patient needs continued PT services  PT Diagnosis Difficulty walking   PT Problem List Decreased strength;Decreased range of motion;Decreased activity tolerance;Decreased mobility;Decreased safety awareness;Decreased knowledge of use of DME;Decreased knowledge of precautions  PT Treatment Interventions DME instruction;Gait training;Stair training;Functional mobility training;Therapeutic activities;Therapeutic exercise;Patient/family education   PT Goals (Current goals can be found in the Care Plan section) Acute Rehab PT Goals Patient Stated Goal: to walk  without pain PT Goal Formulation: With patient Time For Goal Achievement: 04/21/14 Potential to Achieve Goals: Good    Frequency 7X/week   Barriers to discharge        Co-evaluation               End of Session   Activity Tolerance: Patient tolerated treatment well Patient left: in chair;with call bell/phone within reach Nurse Communication: Mobility status         Time: 6962-9528 PT Time Calculation (min): 21 min   Charges:     PT Treatments $Gait Training: 8-22 mins   PT G Codes:          Rada Hay 04/18/2014,  6:31 PM

## 2014-04-18 NOTE — Progress Notes (Signed)
[facility] the following [facility]: RW and Commode  If patient discharges after hours, please call [phone].   [name] 04/18/2014, 3:43 PM

## 2014-04-18 NOTE — Anesthesia Preprocedure Evaluation (Addendum)
Anesthesia Evaluation  Patient identified by MRN, date of birth, ID band Patient awake    Reviewed: Allergy & Precautions, H&P , NPO status , Patient's Chart, lab work & pertinent test results  History of Anesthesia Complications Negative for: history of anesthetic complications  Airway Mallampati: II TM Distance: >3 FB Neck ROM: Full    Dental no notable dental hx.    Pulmonary neg pulmonary ROS,  breath sounds clear to auscultation  Pulmonary exam normal       Cardiovascular negative cardio ROS  Rhythm:Regular Rate:Normal     Neuro/Psych negative neurological ROS  negative psych ROS   GI/Hepatic Neg liver ROS, GERD-  Medicated,  Endo/Other  negative endocrine ROS  Renal/GU negative Renal ROS     Musculoskeletal negative musculoskeletal ROS (+)   Abdominal   Peds  Hematology negative hematology ROS (+)   Anesthesia Other Findings   Reproductive/Obstetrics negative OB ROS                           Anesthesia Physical Anesthesia Plan  ASA: II  Anesthesia Plan: Spinal   Post-op Pain Management:    Induction:   Airway Management Planned:   Additional Equipment:   Intra-op Plan:   Post-operative Plan:   Informed Consent: I have reviewed the patients History and Physical, chart, labs and discussed the procedure including the risks, benefits and alternatives for the proposed anesthesia with the patient or authorized representative who has indicated his/her understanding and acceptance.   Dental advisory given  Plan Discussed with: CRNA  Anesthesia Plan Comments:        Anesthesia Quick Evaluation

## 2014-04-19 DIAGNOSIS — D62 Acute posthemorrhagic anemia: Secondary | ICD-10-CM | POA: Diagnosis not present

## 2014-04-19 DIAGNOSIS — E669 Obesity, unspecified: Secondary | ICD-10-CM | POA: Diagnosis present

## 2014-04-19 LAB — BASIC METABOLIC PANEL
Anion gap: 7 (ref 5–15)
BUN: 14 mg/dL (ref 6–23)
CALCIUM: 8.6 mg/dL (ref 8.4–10.5)
CO2: 26 mEq/L (ref 19–32)
CREATININE: 0.51 mg/dL (ref 0.50–1.10)
Chloride: 105 mEq/L (ref 96–112)
GFR calc Af Amer: >90 mL/min (ref >90)
GFR calc non Af Amer: >90 mL/min (ref >90)
Glucose, Bld: 157 mg/dL — ABNORMAL HIGH (ref 70–99)
Potassium: 4.5 mEq/L (ref 3.7–5.3)
Sodium: 138 mEq/L (ref 137–147)

## 2014-04-19 LAB — CBC
HEMATOCRIT: 30.8 % — AB (ref 36.0–46.0)
Hemoglobin: 10.4 g/dL — ABNORMAL LOW (ref 12.0–15.0)
MCH: 28.2 pg (ref 26.0–34.0)
MCHC: 33.8 g/dL (ref 30.0–36.0)
MCV: 83.5 fL (ref 78.0–100.0)
Platelets: 208 10*3/uL (ref 150–400)
RBC: 3.69 MIL/uL — ABNORMAL LOW (ref 3.87–5.11)
RDW: 15.4 % (ref 11.5–15.5)
WBC: 6.3 10*3/uL (ref 4.0–10.5)

## 2014-04-19 MED ORDER — POLYETHYLENE GLYCOL 3350 17 G PO PACK: 17.0000 g | PACK | Freq: Two times a day (BID) | ORAL | Status: DC

## 2014-04-19 MED ORDER — OXYCODONE HCL 5 MG PO TABS
5.0000 mg | ORAL_TABLET | ORAL | Status: DC
  Administered 2014-04-19: 10 mg via ORAL
  Administered 2014-04-19: 5 mg via ORAL
  Filled 2014-04-19: qty 2
  Filled 2014-04-19: qty 1

## 2014-04-19 MED ORDER — OXYCODONE HCL 5 MG PO TABS
5.0000 mg | ORAL_TABLET | ORAL | Status: DC | PRN
Start: 2014-04-19 — End: 2014-11-01

## 2014-04-19 MED ORDER — ASPIRIN 325 MG PO TBEC
325.0000 mg | DELAYED_RELEASE_TABLET | Freq: Two times a day (BID) | ORAL | Status: AC
Start: 2014-04-19 — End: 2014-05-17

## 2014-04-19 MED ORDER — DSS 100 MG PO CAPS: 100.0000 mg | ORAL_CAPSULE | Freq: Two times a day (BID) | ORAL | Status: DC

## 2014-04-19 MED ORDER — FERROUS SULFATE 325 (65 FE) MG PO TABS: 325.0000 mg | ORAL_TABLET | Freq: Three times a day (TID) | ORAL | Status: DC

## 2014-04-19 MED ORDER — METHOCARBAMOL 500 MG PO TABS: 500.0000 mg | ORAL_TABLET | Freq: Four times a day (QID) | ORAL | Status: DC | PRN

## 2014-04-19 NOTE — Evaluation (Signed)
Occupational Therapy Evaluation Patient Details Name: Kathy Jennings MRN: [id number] DOB: 1951/02/25 Today's Date: 04/19/2014    History of Present Illness [name]   Clinical Impression   Pt doing well. May d/c later today. Husband can assist PRN at home.     Follow Up Recommendations  No OT follow up;Supervision/Assistance - 24 hour    Equipment Recommendations  3 in 1 bedside comode (already in room)    Recommendations for Other Services       Precautions / Restrictions Precautions Precautions: Fall Restrictions Weight Bearing Restrictions: No      Mobility Bed Mobility Overal bed mobility: Modified Independent             General bed mobility comments: pt able to move self to edge of bed, cues on technique, support r leg  Transfers Overall transfer level: Needs assistance Equipment used: Rolling walker (2 wheeled) Transfers: Sit to/from Stand Sit to Stand: Min guard         General transfer comment: min verbal cues for hand placement.    Balance                                            ADL Overall ADL's : Needs assistance/impaired Eating/Feeding: Independent;Sitting   Grooming: Wash/dry hands;Set up;Sitting   Upper Body Bathing: Set up;Sitting   Lower Body Bathing: Moderate assistance;Sit to/from stand   Upper Body Dressing : Set up;Sitting   Lower Body Dressing: Moderate assistance;Sit to/from stand   Toilet Transfer: Minimal assistance;Ambulation;RW;BSC   Toileting- Clothing Manipulation and Hygiene: Minimal assistance;Sit to/from stand         General ADL Comments: Pt needing min cues for safety with walker to not turn too much at one time and take several small steps to turn around. She will have assist with LB self care and is not interested in AE at this time. She plans to sponge bathe initially.      Vision                     Perception     Praxis      Pertinent Vitals/Pain Pain Assessment:  0-10 Pain Score: 5  Pain Location: R hip Pain Descriptors / Indicators: Aching Pain Intervention(s): Ice applied;Repositioned     Hand Dominance     Extremity/Trunk Assessment Upper Extremity Assessment Upper Extremity Assessment: Overall WFL for tasks assessed           Communication Communication Communication: No difficulties   Cognition Arousal/Alertness: Awake/alert Behavior During Therapy: WFL for tasks assessed/performed Overall Cognitive Status: Within Functional Limits for tasks assessed                     General Comments       Exercises       Shoulder Instructions      Home Living Family/patient expects to be discharged to:: Private residence Living Arrangements: Spouse/significant other Available Help at Discharge: Family Type of Home: House Home Access: Stairs to enter   Entrance Stairs-Rails: None Home Layout: One level     Bathroom Shower/Tub: Chief Strategy Officer: Handicapped height     Home Equipment: None          Prior Functioning/Environment Level of Independence: Independent             OT Diagnosis: Generalized weakness  OT Problem List: Decreased strength;Decreased knowledge of use of DME or AE   OT Treatment/Interventions: Self-care/ADL training;Patient/family education;Therapeutic activities;DME and/or AE instruction    OT Goals(Current goals can be found in the care plan section) Acute Rehab OT Goals Patient Stated Goal: to walk without pain, go back to work OT Goal Formulation: With patient Time For Goal Achievement: 04/26/14 Potential to Achieve Goals: Good  OT Frequency: Min 2X/week   Barriers to D/C:            Co-evaluation              End of Session Equipment Utilized During Treatment: Gait belt;Rolling walker  Activity Tolerance: Patient tolerated treatment well Patient left: in chair;with call bell/phone within reach   Time: 1103-1135 OT Time Calculation (min): 32  min Charges:  OT General Charges $OT Visit: 1 Procedure OT Evaluation $Initial OT Evaluation Tier I: 1 Procedure OT Treatments $Self Care/Home Management : 8-22 mins $Therapeutic Activity: 8-22 mins G-Codes:    Lennox LaityStone, Jaspreet Hollings [place] [phone] 04/19/2014, 12:14 PM

## 2014-04-19 NOTE — Progress Notes (Signed)
     Subjective: 1 Day Post-Op Procedure(s) (LRB): RIGHT TOTAL HIP ARTHROPLASTY ANTERIOR APPROACH (Right)   Patient reports pain as moderate.  Objective:   VITALS:   Filed Vitals:   04/19/14 0622  BP: 154/69  Pulse: 48  Temp: 97.6 F (36.4 C)  Resp: 16    Dorsiflexion/Plantar flexion intact Incision: dressing C/D/I No cellulitis present Compartment soft  LABS  Recent Labs  04/19/14 0445  HGB 10.4*  HCT 30.8*  WBC 6.3  PLT 208     Recent Labs  04/19/14 0445  NA 138  K 4.5  BUN 14  CREATININE 0.51  GLUCOSE 157*     Assessment/Plan: 1 Day Post-Op Procedure(s) (LRB): RIGHT TOTAL HIP ARTHROPLASTY ANTERIOR APPROACH (Right) Foley cath d/c'ed Advance diet Up with therapy D/C IV fluids Discharge home with home health [facility] Follow up with OLIN,Abbagail Scaff D in 2 weeks.  Contact information:  [facility] [address] [place] [id number] [phone]    Expected ABLA  Treated with iron and will observe  Obese (BMI 30-39.9) Estimated body mass index is 33.97 kg/(m^2) as calculated from the following:   Height as of this encounter: 5\' 4"  (1.626 m).   Weight as of this encounter: 89.812 kg (198 lb). Patient also counseled that weight may inhibit the healing process Patient counseled that losing weight will help with future health issues      Anastasio AuerbachMatthew S. Eileene Kisling   PAC  04/19/2014, 9:58 AM

## 2014-04-19 NOTE — Progress Notes (Signed)
Physical Therapy Treatment Patient Details Name: Kathy Jennings MRN: [id number] DOB: 02/03/1951 Today's Date: 04/19/2014    History of Present Illness [name]    PT Comments    Pt progressing well. Ready for DC.  Follow Up Recommendations  Home health PT;Supervision/Assistance - 24 hour     Equipment Recommendations       Recommendations for Other Services       Precautions / Restrictions Precautions Precautions: Fall Restrictions Weight Bearing Restrictions: No    Mobility  Bed Mobility                  Transfers Overall transfer level: Needs assistance Equipment used: Rolling walker (2 wheeled) Transfers: Sit to/from Stand Sit to Stand: Supervision         General transfer comment: multiple times from Recliner, cues for hand placement  Ambulation/Gait Ambulation/Gait assistance: Min guard Ambulation Distance (Feet): 80 Feet Assistive device: Rolling walker (2 wheeled) Gait Pattern/deviations: Step-to pattern         Stairs Stairs: Yes Stairs assistance: Min assist Stair Management: No rails;Backwards;With walker Number of Stairs: 2 General stair comments: spouse present to assist.  Wheelchair Mobility    Modified Rankin (Stroke Patients Only)       Balance                                    Cognition Arousal/Alertness: Awake/alert Behavior During Therapy: WFL for tasks assessed/performed Overall Cognitive Status: Within Functional Limits for tasks assessed                      Exercises Total Joint Exercises Ankle Circles/Pumps: AROM;Both;10 reps;Supine Quad Sets: AROM;Right;5 reps;Supine Heel Slides: AAROM;Right;10 reps;Supine Hip ABduction/ADduction: AAROM;Right;10 reps;Supine Long Arc Quad: AROM;Right;10 reps;Seated    General Comments        Pertinent Vitals/Pain Pain Assessment: 0-10 Pain Score: 2  Pain Location: R thigh Pain Descriptors / Indicators: Aching;Tightness Pain  Intervention(s): Ice applied;Repositioned    Home Living Family/patient expects to be discharged to:: Private residence Living Arrangements: Spouse/significant other Available Help at Discharge: Family Type of Home: House Home Access: Stairs to enter Entrance Stairs-Rails: None Home Layout: One level Home Equipment: None      Prior Function Level of Independence: Independent          PT Goals (current goals can now be found in the care plan section) Acute Rehab PT Goals Patient Stated Goal: to walk without pain, go back to work Progress towards PT goals: Progressing toward goals    Frequency       PT Plan Current plan remains appropriate    Co-evaluation             End of Session   Activity Tolerance: Patient tolerated treatment well Patient left: in chair;with call bell/phone within reach     Time: 1329-1419 PT Time Calculation (min): 50 min  Charges:  $Gait Training: 23-37 mins $Therapeutic Exercise: 8-22 mins                    G Codes:      Rada HayHill, Epic [place] Elizabeth 04/19/2014, 2:46 PM

## 2014-04-19 NOTE — Progress Notes (Signed)
Physical Therapy Treatment Patient Details Name: Kathy Jennings MRN: [id number] DOB: [birth year] Today's Date: 04/19/2014    History of Present Illness Kathy Jennings    PT Comments    *Good progress with mobility. Pt walked 50' but then became overheated and a bit dizzy. Vitals: HR 48, BP 121/65, SaO2 100% on RA. RN aware. Will see for afternoon session. **  Follow Up Recommendations  Home health PT;Supervision/Assistance - 24 hour     Equipment Recommendations  Rolling walker with 5" wheels    Recommendations for Other Services       Precautions / Restrictions Precautions Precautions: Fall Restrictions Weight Bearing Restrictions: No    Mobility  Bed Mobility Overal bed mobility: Modified Independent             General bed mobility comments: pt able to move self to edge of bed, cues on technique, support Kathy leg  Transfers Overall transfer level: Needs assistance Equipment used: Rolling walker (2 wheeled) Transfers: Sit to/from Stand Sit to Stand: Supervision         General transfer comment: cues for hand placement/technique [facility]  Ambulation/Gait Ambulation/Gait assistance: Min guard Ambulation Distance (Feet): 50 Feet Assistive device: Rolling walker (2 wheeled) Gait Pattern/deviations: Step-to pattern   Gait velocity interpretation: Below normal speed for age/gender     Stairs            Wheelchair Mobility    Modified Rankin (Stroke Patients Only)       Balance                                    Cognition                            Exercises Total Joint Exercises Ankle Circles/Pumps: AROM;Both;10 reps;Supine Quad Sets: AROM;Right;5 reps;Supine Heel Slides: AAROM;Right;10 reps;Supine Hip ABduction/ADduction: AAROM;Right;10 reps;Supine    General Comments        Pertinent Vitals/Pain Pain Score: 4  Pain Location: Kathy hip Pain Descriptors / Indicators: Aching Pain Intervention(s):  Monitored during session;Ice applied;Patient requesting pain meds-RN notified    Home Living                      Prior Function            PT Goals (current goals can now be found in the care plan section) Acute Rehab PT Goals Patient Stated Goal: to walk without pain, go back to work PT Goal Formulation: With patient Time For Goal Achievement: 04/21/14 Potential to Achieve Goals: Good Progress towards PT goals: Progressing toward goals    Frequency  7X/week    PT Plan Current plan remains appropriate    Co-evaluation             End of Session Equipment Utilized During Treatment: Gait belt Activity Tolerance: Patient tolerated treatment well Patient left: in chair;with call bell/phone within reach     Time: 4098-11910858-0936 PT Time Calculation (min): 38 min  Charges:  $Gait Training: 8-22 mins $Therapeutic Exercise: 8-22 mins $Therapeutic Activity: 8-22 mins                    G Codes:      Kathy Jennings, Kathy Jennings 04/19/2014, 10:18 AM [phone]

## 2014-04-19 NOTE — Care Management Note (Signed)
    Page 1 of 1   04/19/2014     10:26:39 AM CARE MANAGEMENT NOTE 04/19/2014  Patient:  Kathy Jennings,Kathy Jennings   Account Number:  [id number]  Date Initiated:  04/19/2014  Documentation initiated by:  Lorenda IshiharaPEELE,Suzane Vanderweide  Subjective/Objective Assessment:   63 yo female admitted s/p right THA. PTA lived at home with spouse.     Action/Plan:   Home when stable   Anticipated DC Date:  04/19/2014   Anticipated DC Plan:  HOME W HOME HEALTH SERVICES      DC Planning Services  CM consult      [facility]   Choice offered to / List presented to:  C-1 Patient        HH arranged  HH-2 PT      [facility]  [facility]   Status of service:  Completed, signed off Medicare Important Message given?   (If response is "NO", the following Medicare IM given date fields will be blank) Date Medicare IM given:   Medicare IM given by:   Date Additional Medicare IM given:   Additional Medicare IM given by:    Discharge Disposition:  HOME W HOME HEALTH SERVICES  Per UR Regulation:  Reviewed for med. necessity/level of care/duration of stay  If discussed at Long Length of Stay Meetings, dates discussed:    Comments:

## 2014-04-25 NOTE — Discharge Summary (Signed)
Physician Discharge Summary  Patient ID: Kathy Jennings MRN: [id number] DOB/AGE: 09/21/1950 63 y.o.  Admit date: 04/18/2014 Discharge date: 04/19/2014   Procedures:  Procedure(s) (LRB): RIGHT TOTAL HIP ARTHROPLASTY ANTERIOR APPROACH (Right)  Attending Physician:  Dr. Durene Jennings   Admission Diagnoses:   Right hip OA / pain  Discharge Diagnoses:  Principal Problem:   S/P right THA, AA Active Problems:   Postoperative anemia due to acute blood loss   Obese  Past Medical History  Diagnosis Date  . Complication of anesthesia     slow to wake up  . Arthritis   . GERD (gastroesophageal reflux disease)   . OAB (overactive bladder)   . Urgency of urination   . Varicose veins     HPI: Kathy Jennings, 63 y.o. female, has a history of pain and functional disability in the right hip(s) due to arthritis and patient has failed non-surgical conservative treatments for greater than 12 weeks to include NSAID's and/or analgesics and activity modification. Onset of symptoms was gradual starting 5+ years ago with gradually worsening course since that time.The patient noted no past surgery on the right hip(s). Patient currently rates pain in the right hip at 5 out of 10 with activity. Patient has night pain, worsening of pain with activity and weight bearing, trendelenberg gait, pain that interfers with activities of daily living and pain with passive range of motion. Patient has evidence of periarticular osteophytes and joint space narrowing by imaging studies. This condition presents safety issues increasing the risk of falls. There is no current active infection. Risks, benefits and expectations were discussed with the patient. Risks including but not limited to the risk of anesthesia, blood clots, nerve damage, blood vessel damage, failure of the prosthesis, infection and up to and including death. Patient understand the risks, benefits and expectations and wishes to proceed with surgery.    PCP: Kathy Pandy, MD   Discharged Condition: good  Hospital Course:  Patient underwent the above stated procedure on 04/18/2014. Patient tolerated the procedure well and brought to the recovery room in good condition and subsequently to the floor.  POD #1 BP: 154/69 ; Pulse: 48 ; Temp: 97.6 F (36.4 C) ; Resp: 16 Patient reports pain as moderate. No events throughout the night. Ready to be discharged home. Dorsiflexion/plantar flexion intact, incision: dressing C/D/I, no cellulitis present and compartment soft.   LABS  Basename    HGB  10.4  HCT  30.8    Discharge Exam: General appearance: alert, cooperative and no distress Extremities: Homans sign is negative, no sign of DVT, no edema, redness or tenderness in the calves or thighs and no ulcers, gangrene or trophic changes  Disposition: Home with follow up in 2 weeks   Follow-up Information   Follow up with Kathy Pal, MD. Schedule an appointment as soon as possible for a visit in 2 weeks.   Specialty:  Orthopedic Surgery   Contact information:   [address] [place] [address] [phone]       Discharge Instructions   Call MD / Call 911    Complete by:  As directed   If you experience chest pain or shortness of breath, CALL 911 and be transported to the hospital emergency room.  If you develope a fever above 101 F, pus (white drainage) or increased drainage or redness at the wound, or calf pain, call your surgeon's office.     Change dressing    Complete by:  As  directed   Maintain surgical dressing for 10-14 days, or until follow up in the clinic.     Constipation Prevention    Complete by:  As directed   Drink plenty of fluids.  Prune juice may be helpful.  You may use a stool softener, such as Colace (over the counter) 100 mg twice a day.  Use MiraLax (over the counter) for constipation as needed.     Diet - low sodium heart healthy    Complete by:  As directed      Discharge  instructions    Complete by:  As directed   Maintain surgical dressing for 10-14 days, or until follow up in the clinic. Follow up in 2 weeks at [facility]. Call with any questions or concerns.     Increase activity slowly as tolerated    Complete by:  As directed      TED hose    Complete by:  As directed   Use stockings (TED hose) for 2 weeks on both leg(s).  You may remove them at night for sleeping.     Weight bearing as tolerated    Complete by:  As directed   Laterality:  right  Extremity:  Lower             Medication List    STOP taking these medications       naproxen 500 MG tablet  Commonly known as:  NAPROSYN      TAKE these medications       acetaminophen 500 MG tablet  Commonly known as:  TYLENOL  Take 500 mg by mouth every 6 (six) hours as needed for mild pain.     aspirin 325 MG EC tablet  Take 1 tablet (325 mg total) by mouth 2 (two) times daily.     Bee Pollen 550 MG Caps  Take 1 capsule by mouth daily.     DSS 100 MG Caps  Take 100 mg by mouth 2 (two) times daily.     ferrous sulfate 325 (65 FE) MG tablet  Take 1 tablet (325 mg total) by mouth 3 (three) times daily after meals.     methocarbamol 500 MG tablet  Commonly known as:  ROBAXIN  Take 1 tablet (500 mg total) by mouth every 6 (six) hours as needed for muscle spasms.     multivitamin with minerals Tabs tablet  Take 1 tablet by mouth daily.     Omega 3 1200 MG Caps  Take 1 capsule by mouth 2 (two) times daily.     omeprazole 20 MG capsule  Commonly known as:  PRILOSEC  Take 20 mg by mouth daily.     oxyCODONE 5 MG immediate release tablet  Commonly known as:  Oxy IR/ROXICODONE  Take 1-3 tablets (5-15 mg total) by mouth every 4 (four) hours as needed for severe pain.     polyethylene glycol packet  Commonly known as:  MIRALAX / GLYCOLAX  Take 17 g by mouth 2 (two) times daily.     Vitamin D 2000 UNITS tablet  Take 2,000 Units by mouth daily.          Signed: Anastasio Auerbach. Finis Hendricksen   PA-C  04/25/2014, 8:30 AM

## 2014-10-19 NOTE — H&P (Signed)
TOTAL KNEE ADMISSION H&P  Patient is being admitted for right total knee arthroplasty.  Subjective:  Chief Complaint:    Right knee primary OA / pain.  HPI: Kathy Jennings, 64 y.o. female, has a history of pain and functional disability in the right knee due to arthritis and has failed non-surgical conservative treatments for greater than 12 weeks to include NSAID's and/or analgesics, corticosteriod injections, supervised PT with diminished ADL's post treatment, use of assistive devices and activity modification.  Onset of symptoms was gradual, starting ~1 years ago with gradually worsening course since that time. The patient noted no past surgery on the right knee(s).  Patient currently rates pain in the right knee(s) at 8 out of 10 with activity. Patient has night pain, worsening of pain with activity and weight bearing, pain that interferes with activities of daily living, pain with passive range of motion, crepitus and joint swelling.  Patient has evidence of periarticular osteophytes and joint space narrowing by imaging studies.  There is no active infection.  Risks, benefits and expectations were discussed with the patient.  Risks including but not limited to the risk of anesthesia, blood clots, nerve damage, blood vessel damage, failure of the prosthesis, infection and up to and including death.  Patient understand the risks, benefits and expectations and wishes to proceed with surgery.   PCP: Estanislado PandySASSER,PAUL W, MD  D/C Plans:      Home with HHPT  Post-op Meds:       No Rx given  Tranexamic Acid:      To be given - IV   Decadron:      Is to be given  FYI:     ASA post-op  Oxycodone post-op    Patient Active Problem List   Diagnosis Date Noted  . Postoperative anemia due to acute blood loss 04/19/2014  . Obese 04/19/2014  . S/P right THA, AA 04/18/2014   Past Medical History  Diagnosis Date  . Complication of anesthesia     slow to wake up  . Arthritis   . GERD  (gastroesophageal reflux disease)   . OAB (overactive bladder)   . Urgency of urination   . Varicose veins     Past Surgical History  Procedure Laterality Date  . Abdominal hysterectomy  30 YRS AGO  . Colonoscopy  2005  . Total hip arthroplasty Right 04/18/2014    Procedure: RIGHT TOTAL HIP ARTHROPLASTY ANTERIOR APPROACH;  Surgeon: Shelda PalMatthew D Olin, MD;  Location: [facility] ORS;  Service: Orthopedics;  Laterality: Right;    No prescriptions prior to admission   Allergies  Allergen Reactions  . Sulfa Antibiotics Nausea And Vomiting    History  Substance Use Topics  . Smoking status: Never Smoker   . Smokeless tobacco: Not on file  . Alcohol Use: No       Review of Systems  Constitutional: Negative.   HENT: Negative.   Eyes: Negative.   Respiratory: Negative.   Cardiovascular: Negative.   Gastrointestinal: Positive for heartburn.  Genitourinary: Positive for urgency and frequency.  Musculoskeletal: Positive for joint pain.  Skin: Negative.   Neurological: Negative.   Endo/Heme/Allergies: Positive for environmental allergies.  Psychiatric/Behavioral: Negative.     Objective:  Physical Exam  Constitutional: She is oriented to person, place, and time. She appears well-developed and well-nourished.  HENT:  Head: Normocephalic and atraumatic.  Mouth/Throat: She has dentures.  Eyes: Pupils are equal, round, and reactive to light.  Neck: Neck supple. No JVD present. No tracheal deviation  present. No thyromegaly present.  Cardiovascular: Normal rate, regular rhythm, normal heart sounds and intact distal pulses.   Respiratory: Effort normal and breath sounds normal. No stridor. No respiratory distress. She has no wheezes.  GI: Soft. There is no tenderness. There is no guarding.  Musculoskeletal:       Right knee: She exhibits decreased range of motion, swelling and bony tenderness. She exhibits no ecchymosis, no deformity, no laceration and no erythema. Tenderness found.   Lymphadenopathy:    She has no cervical adenopathy.  Neurological: She is alert and oriented to person, place, and time.  Skin: Skin is warm and dry.  Psychiatric: She has a normal mood and affect.     Labs:  Estimated body mass index is 33.97 kg/(m^2) as calculated from the following:   Height as of 04/18/14:  (1.626 m).   Weight as of 04/06/14: 89.812 kg (198 lb).   Imaging Review Plain radiographs demonstrate moderate degenerative joint disease of the right knee and MRI revealed meniscal and chondral injuries along with a stress fracture. The overall alignment is neutral. The bone quality appears to be good for age and reported activity level.  Assessment/Plan:  End stage arthritis, right knee   The patient history, physical examination, clinical judgment of the provider and imaging studies are consistent with end stage degenerative joint disease of the right knee(s) and total knee arthroplasty is deemed medically necessary. The treatment options including medical management, injection therapy arthroscopy and arthroplasty were discussed at length. The risks and benefits of total knee arthroplasty were presented and reviewed. The risks due to aseptic loosening, infection, stiffness, patella tracking problems, thromboembolic complications and other imponderables were discussed. The patient acknowledged the explanation, agreed to proceed with the plan and consent was signed. Patient is being admitted for inpatient treatment for surgery, pain control, PT, OT, prophylactic antibiotics, VTE prophylaxis, progressive ambulation and ADL's and discharge planning. The patient is planning to be discharged home with home health services.     Anastasio Auerbach Ninel Abdella   PA-C  10/19/2014, 4:35 PM

## 2014-10-20 NOTE — Patient Instructions (Addendum)
Kathy Jennings  10/20/2014   Your procedure is scheduled on: 10/31/2014    Report to [facility] Main  Entrance and follow signs to               Short Stay Center at     1000 AM.  Call this number if you have problems the morning of surgery [phone]   Remember:  Do not eat food or drink liquids :After Midnight.     Take these medicines the morning of surgery with A SIP OF WATER: omeprazole ( Prilosec)                                You may not have any metal on your body including hair pins and              piercings  Do not wear jewelry, make-up, lotions, powders or perfumes., deodorant.               Do not wear nail polish.  Do not shave  48 hours prior to surgery.                Do not bring valuables to the hospital. [place] IS NOT             RESPONSIBLE   FOR VALUABLES.  Contacts, dentures or bridgework may not be worn into surgery.  Leave suitcase in the car. After surgery it may be brought to your room.     Special Instructions:coughing and deep breathing exercises, leg exercises               Please read over the following fact sheets you were given: _____________________________________________________________________             [facility] - Preparing for Surgery Before surgery, you can play an important role.  Because skin is not sterile, your skin needs to be as free of germs as possible.  You can reduce the number of germs on your skin by washing with CHG (chlorahexidine gluconate) soap before surgery.  CHG is an antiseptic cleaner which kills germs and bonds with the skin to continue killing germs even after washing. Please DO NOT use if you have an allergy to CHG or antibacterial soaps.  If your skin becomes reddened/irritated stop using the CHG and inform your nurse when you arrive at Short Stay. Do not shave (including legs and underarms) for at least 48 hours prior to the first CHG shower.  You may shave your face/neck. Please  follow these instructions carefully:  1.  Shower with CHG Soap the night before surgery and the  morning of Surgery.  2.  If you choose to wash your hair, wash your hair first as usual with your  normal  shampoo.  3.  After you shampoo, rinse your hair and body thoroughly to remove the  shampoo.                           4.  Use CHG as you would any other liquid soap.  You can apply chg directly  to the skin and wash                       Gently with a scrungie or clean washcloth.  5.  Apply the  CHG Soap to your body ONLY FROM THE NECK DOWN.   Do not use on face/ open                           Wound or open sores. Avoid contact with eyes, ears mouth and genitals (private parts).                       Wash face,  Genitals (private parts) with your normal soap.             6.  Wash thoroughly, paying special attention to the area where your surgery  will be performed.  7.  Thoroughly rinse your body with warm water from the neck down.  8.  DO NOT shower/wash with your normal soap after using and rinsing off  the CHG Soap.                9.  Pat yourself dry with a clean towel.            10.  Wear clean pajamas.            11.  Place clean sheets on your bed the night of your first shower and do not  sleep with pets. Day of Surgery : Do not apply any lotions/deodorants the morning of surgery.  Please wear clean clothes to the hospital/surgery center.  FAILURE TO FOLLOW THESE INSTRUCTIONS MAY RESULT IN THE CANCELLATION OF YOUR SURGERY PATIENT SIGNATURE_________________________________  NURSE SIGNATURE__________________________________  ________________________________________________________________________  WHAT IS A BLOOD TRANSFUSION? Blood Transfusion Information  A transfusion is the replacement of blood or some of its parts. Blood is made up of multiple cells which provide different functions.  Red blood cells carry oxygen and are used for blood loss replacement.  White blood cells  fight against infection.  Platelets control bleeding.  Plasma helps clot blood.  Other blood products are available for specialized needs, such as hemophilia or other clotting disorders. BEFORE THE TRANSFUSION  Who gives blood for transfusions?   Healthy volunteers who are fully evaluated to make sure their blood is safe. This is blood bank blood. Transfusion therapy is the safest it has ever been in the practice of medicine. Before blood is taken from a donor, a complete history is taken to make sure that person has no history of diseases nor engages in risky social behavior (examples are intravenous drug use or sexual activity with multiple partners). The donor's travel history is screened to minimize risk of transmitting infections, such as malaria. The donated blood is tested for signs of infectious diseases, such as HIV and hepatitis. The blood is then tested to be sure it is compatible with you in order to minimize the chance of a transfusion reaction. If you or a relative donates blood, this is often done in anticipation of surgery and is not appropriate for emergency situations. It takes many days to process the donated blood. RISKS AND COMPLICATIONS Although transfusion therapy is very safe and saves many lives, the main dangers of transfusion include:  1. Getting an infectious disease. 2. Developing a transfusion reaction. This is an allergic reaction to something in the blood you were given. Every precaution is taken to prevent this. The decision to have a blood transfusion has been considered carefully by your caregiver before blood is given. Blood is not given unless the benefits outweigh the risks. AFTER THE TRANSFUSION  Right after receiving a blood transfusion,  you will usually feel much better and more energetic. This is especially true if your red blood cells have gotten low (anemic). The transfusion raises the level of the red blood cells which carry oxygen, and this usually  causes an energy increase.  The nurse administering the transfusion will monitor you carefully for complications. HOME CARE INSTRUCTIONS  No special instructions are needed after a transfusion. You may find your energy is better. Speak with your caregiver about any limitations on activity for underlying diseases you may have. SEEK MEDICAL CARE IF:   Your condition is not improving after your transfusion.  You develop redness or irritation at the intravenous (IV) site. SEEK IMMEDIATE MEDICAL CARE IF:  Any of the following symptoms occur over the next 12 hours:  Shaking chills.  You have a temperature by mouth above 102 F (38.9 C), not controlled by medicine.  Chest, back, or muscle pain.  People around you feel you are not acting correctly or are confused.  Shortness of breath or difficulty breathing.  Dizziness and fainting.  You get a rash or develop hives.  You have a decrease in urine output.  Your urine turns a dark color or changes to pink, red, or brown. Any of the following symptoms occur over the next 10 days:  You have a temperature by mouth above 102 F (38.9 C), not controlled by medicine.  Shortness of breath.  Weakness after normal activity.  The white part of the eye turns yellow (jaundice).  You have a decrease in the amount of urine or are urinating less often.  Your urine turns a dark color or changes to pink, red, or brown. Document Released: 08/15/2000 Document Revised: 11/10/2011 Document Reviewed: 04/03/2008 Exit[employer] Patient Information 2014 [place].  _______________________________________________________________________  Incentive Spirometer  An incentive spirometer is a tool that can help keep your lungs clear and active. This tool measures how well you are filling your lungs with each breath. Taking long deep breaths may help reverse or decrease the chance of developing breathing (pulmonary) problems (especially infection)  following:  A long period of time when you are unable to move or be active. BEFORE THE PROCEDURE   If the spirometer includes an indicator to show your best effort, your nurse or respiratory therapist will set it to a desired goal.  If possible, sit up straight or lean slightly forward. Try not to slouch.  Hold the incentive spirometer in an upright position. INSTRUCTIONS FOR USE  3. Sit on the edge of your bed if possible, or sit up as far as you can in bed or on a chair. 4. Hold the incentive spirometer in an upright position. 5. Breathe out normally. 6. Place the mouthpiece in your mouth and seal your lips tightly around it. 7. Breathe in slowly and as deeply as possible, raising the piston or the ball toward the top of the column. 8. Hold your breath for 3-5 seconds or for as long as possible. Allow the piston or ball to fall to the bottom of the column. 9. Remove the mouthpiece from your mouth and breathe out normally. 10. Rest for a few seconds and repeat Steps 1 through 7 at least 10 times every 1-2 hours when you are awake. Take your time and take a few normal breaths between deep breaths. 11. The spirometer may include an indicator to show your best effort. Use the indicator as a goal to work toward during each repetition. 12. After each set of 10 deep breaths,  practice coughing to be sure your lungs are clear. If you have an incision (the cut made at the time of surgery), support your incision when coughing by placing a pillow or rolled up towels firmly against it. Once you are able to get out of bed, walk around indoors and cough well. You may stop using the incentive spirometer when instructed by your caregiver.  RISKS AND COMPLICATIONS  Take your time so you do not get dizzy or light-headed.  If you are in pain, you may need to take or ask for pain medication before doing incentive spirometry. It is harder to take a deep breath if you are having pain. AFTER USE  Rest and  breathe slowly and easily.  It can be helpful to keep track of a log of your progress. Your caregiver can provide you with a simple table to help with this. If you are using the spirometer at home, follow these instructions: [place] IF:   You are having difficultly using the spirometer.  You have trouble using the spirometer as often as instructed.  Your pain medication is not giving enough relief while using the spirometer.  You develop fever of 100.5 F (38.1 C) or higher. SEEK IMMEDIATE MEDICAL CARE IF:   You cough up bloody sputum that had not been present before.  You develop fever of 102 F (38.9 C) or greater.  You develop worsening pain at or near the incision site. MAKE SURE YOU:   Understand these instructions.  Will watch your condition.  Will get help right away if you are not doing well or get worse. Document Released: 12/29/2006 Document Revised: 11/10/2011 Document Reviewed: 03/01/2007 [facility] Patient Information 2014 [place], [place].   ________________________________________________________________________

## 2014-10-23 ENCOUNTER — Encounter (HOSPITAL_COMMUNITY)
Admission: RE | Admit: 2014-10-23 | Discharge: 2014-10-23 | Disposition: A | Discharge status: Home / Self Care | Payer: BLUE CROSS/BLUE SHIELD | Source: Ambulatory Visit | Attending: Orthopedic Surgery | Admitting: Orthopedic Surgery

## 2014-10-23 ENCOUNTER — Encounter (HOSPITAL_COMMUNITY): Payer: Self-pay

## 2014-10-23 DIAGNOSIS — Z01818 Encounter for other preprocedural examination: Secondary | ICD-10-CM | POA: Diagnosis not present

## 2014-10-23 DIAGNOSIS — M179 Osteoarthritis of knee, unspecified: Secondary | ICD-10-CM | POA: Diagnosis not present

## 2014-10-23 LAB — URINALYSIS, ROUTINE W REFLEX MICROSCOPIC
Bilirubin Urine: NEGATIVE
Glucose, UA: NEGATIVE mg/dL
HGB URINE DIPSTICK: NEGATIVE
Ketones, ur: NEGATIVE mg/dL
Leukocytes, UA: NEGATIVE
Nitrite: NEGATIVE
Protein, ur: NEGATIVE mg/dL
SPECIFIC GRAVITY, URINE: 1.024 (ref 1.005–1.030)
UROBILINOGEN UA: 0.2 mg/dL (ref 0.0–1.0)
pH: 6 (ref 5.0–8.0)

## 2014-10-23 LAB — BASIC METABOLIC PANEL
ANION GAP: 7 (ref 5–15)
BUN: 31 mg/dL — ABNORMAL HIGH (ref 6–23)
CALCIUM: 9.8 mg/dL (ref 8.4–10.5)
CO2: 28 mmol/L (ref 19–32)
CREATININE: 0.56 mg/dL (ref 0.50–1.10)
Chloride: 103 mmol/L (ref 96–112)
GFR calc non Af Amer: >90 mL/min (ref >90)
Glucose, Bld: 88 mg/dL (ref 70–99)
POTASSIUM: 4 mmol/L (ref 3.5–5.1)
SODIUM: 138 mmol/L (ref 135–145)

## 2014-10-23 LAB — SURGICAL PCR SCREEN
MRSA, PCR: NEGATIVE
STAPHYLOCOCCUS AUREUS: POSITIVE — AB

## 2014-10-23 LAB — CBC
HEMATOCRIT: 37.6 % (ref 36.0–46.0)
Hemoglobin: 12.3 g/dL (ref 12.0–15.0)
MCH: 27.3 pg (ref 26.0–34.0)
MCHC: 32.7 g/dL (ref 30.0–36.0)
MCV: 83.4 fL (ref 78.0–100.0)
Platelets: 213 10*3/uL (ref 150–400)
RBC: 4.51 MIL/uL (ref 3.87–5.11)
RDW: 15.9 % — AB (ref 11.5–15.5)
WBC: 3.9 10*3/uL — ABNORMAL LOW (ref 4.0–10.5)

## 2014-10-23 LAB — APTT: aPTT: 32 seconds (ref 24–37)

## 2014-10-23 LAB — PROTIME-INR
INR: 0.97 (ref 0.00–1.49)
PROTHROMBIN TIME: 13 s (ref 11.6–15.2)

## 2014-10-23 NOTE — Progress Notes (Signed)
bmet results faxed to dr olin by epic 

## 2014-10-23 NOTE — Progress Notes (Signed)
Clearance- Dr Neita CarpSasser 10/11/14 on chart

## 2014-10-31 ENCOUNTER — Encounter (HOSPITAL_COMMUNITY)
Admission: RE | Disposition: A | Discharge status: Home Health | Payer: Self-pay | Source: Ambulatory Visit | Attending: Orthopedic Surgery

## 2014-10-31 ENCOUNTER — Inpatient Hospital Stay (HOSPITAL_COMMUNITY): Payer: BLUE CROSS/BLUE SHIELD | Admitting: Anesthesiology

## 2014-10-31 ENCOUNTER — Inpatient Hospital Stay (HOSPITAL_COMMUNITY)
Admission: RE | Admit: 2014-10-31 | Discharge: 2014-11-01 | DRG: 470 | Disposition: A | Discharge status: Home Health | Payer: BLUE CROSS/BLUE SHIELD | Source: Ambulatory Visit | Attending: Orthopedic Surgery | Admitting: Orthopedic Surgery

## 2014-10-31 ENCOUNTER — Encounter (HOSPITAL_COMMUNITY): Payer: Self-pay | Admitting: *Deleted

## 2014-10-31 DIAGNOSIS — K219 Gastro-esophageal reflux disease without esophagitis: Secondary | ICD-10-CM | POA: Diagnosis present

## 2014-10-31 DIAGNOSIS — M1711 Unilateral primary osteoarthritis, right knee: Principal | ICD-10-CM | POA: Diagnosis present

## 2014-10-31 DIAGNOSIS — Z96651 Presence of right artificial knee joint: Secondary | ICD-10-CM

## 2014-10-31 DIAGNOSIS — M659 Synovitis and tenosynovitis, unspecified: Secondary | ICD-10-CM | POA: Diagnosis present

## 2014-10-31 DIAGNOSIS — Z01812 Encounter for preprocedural laboratory examination: Secondary | ICD-10-CM

## 2014-10-31 DIAGNOSIS — E669 Obesity, unspecified: Secondary | ICD-10-CM | POA: Diagnosis present

## 2014-10-31 DIAGNOSIS — Z6835 Body mass index (BMI) 35.0-35.9, adult: Secondary | ICD-10-CM

## 2014-10-31 DIAGNOSIS — Z96641 Presence of right artificial hip joint: Secondary | ICD-10-CM | POA: Diagnosis present

## 2014-10-31 DIAGNOSIS — Z96659 Presence of unspecified artificial knee joint: Secondary | ICD-10-CM

## 2014-10-31 DIAGNOSIS — M25561 Pain in right knee: Secondary | ICD-10-CM | POA: Diagnosis present

## 2014-10-31 HISTORY — PX: TOTAL KNEE ARTHROPLASTY: SHX125

## 2014-10-31 LAB — TYPE AND SCREEN
ABO/RH(D): A POS
Antibody Screen: NEGATIVE

## 2014-10-31 SURGERY — ARTHROPLASTY, KNEE, TOTAL
Anesthesia: Spinal | Site: Knee | Laterality: Right

## 2014-10-31 MED ORDER — PROPOFOL 10 MG/ML IV BOLUS
INTRAVENOUS | Status: AC
  Filled 2014-10-31: qty 20

## 2014-10-31 MED ORDER — FERROUS SULFATE 325 (65 FE) MG PO TABS
325.0000 mg | ORAL_TABLET | Freq: Three times a day (TID) | ORAL | Status: DC
  Administered 2014-10-31 – 2014-11-01 (×3): 325 mg via ORAL
  Filled 2014-10-31 (×6): qty 1

## 2014-10-31 MED ORDER — ACETAMINOPHEN 10 MG/ML IV SOLN
1000.0000 mg | Freq: Once | INTRAVENOUS | Status: AC
  Administered 2014-10-31: 1000 mg via INTRAVENOUS
  Filled 2014-10-31: qty 100

## 2014-10-31 MED ORDER — DEXTROSE 5 % IV SOLN
500.0000 mg | Freq: Four times a day (QID) | INTRAVENOUS | Status: DC | PRN
  Administered 2014-10-31: 500 mg via INTRAVENOUS
  Filled 2014-10-31 (×2): qty 5

## 2014-10-31 MED ORDER — BISACODYL 10 MG RE SUPP: 10.0000 mg | Freq: Every day | RECTAL | Status: DC | PRN

## 2014-10-31 MED ORDER — CEFAZOLIN SODIUM-DEXTROSE 2-3 GM-% IV SOLR
2.0000 g | Freq: Four times a day (QID) | INTRAVENOUS | Status: AC
  Administered 2014-10-31 (×2): 2 g via INTRAVENOUS
  Filled 2014-10-31 (×2): qty 50

## 2014-10-31 MED ORDER — DEXAMETHASONE SODIUM PHOSPHATE 10 MG/ML IJ SOLN
10.0000 mg | Freq: Once | INTRAMUSCULAR | Status: AC
  Administered 2014-11-01: 10 mg via INTRAVENOUS
  Filled 2014-10-31: qty 1

## 2014-10-31 MED ORDER — DOCUSATE SODIUM 100 MG PO CAPS
100.0000 mg | ORAL_CAPSULE | Freq: Two times a day (BID) | ORAL | Status: DC
  Administered 2014-10-31 – 2014-11-01 (×3): 100 mg via ORAL

## 2014-10-31 MED ORDER — DIPHENHYDRAMINE HCL 25 MG PO CAPS: 25.0000 mg | ORAL_CAPSULE | Freq: Four times a day (QID) | ORAL | Status: DC | PRN

## 2014-10-31 MED ORDER — ASPIRIN EC 325 MG PO TBEC
325.0000 mg | DELAYED_RELEASE_TABLET | Freq: Two times a day (BID) | ORAL | Status: DC
  Administered 2014-11-01: 325 mg via ORAL
  Filled 2014-10-31 (×3): qty 1

## 2014-10-31 MED ORDER — CEFAZOLIN SODIUM-DEXTROSE 2-3 GM-% IV SOLR
INTRAVENOUS | Status: AC
  Filled 2014-10-31: qty 50

## 2014-10-31 MED ORDER — DEXAMETHASONE SODIUM PHOSPHATE 10 MG/ML IJ SOLN
INTRAMUSCULAR | Status: AC
  Filled 2014-10-31: qty 1

## 2014-10-31 MED ORDER — METOCLOPRAMIDE HCL 10 MG PO TABS: 5.0000 mg | ORAL_TABLET | Freq: Three times a day (TID) | ORAL | Status: DC | PRN

## 2014-10-31 MED ORDER — TRANEXAMIC ACID 100 MG/ML IV SOLN
1000.0000 mg | Freq: Once | INTRAVENOUS | Status: AC
  Administered 2014-10-31: 1000 mg via INTRAVENOUS
  Filled 2014-10-31: qty 10

## 2014-10-31 MED ORDER — MENTHOL 3 MG MT LOZG
1.0000 | LOZENGE | OROMUCOSAL | Status: DC | PRN
  Filled 2014-10-31: qty 9

## 2014-10-31 MED ORDER — EPHEDRINE SULFATE 50 MG/ML IJ SOLN
INTRAMUSCULAR | Status: AC
  Filled 2014-10-31: qty 1

## 2014-10-31 MED ORDER — BUPIVACAINE HCL (PF) 0.75 % IJ SOLN
INTRAMUSCULAR | Status: DC | PRN
  Administered 2014-10-31: 15 mg via INTRATHECAL

## 2014-10-31 MED ORDER — HYDROMORPHONE HCL 1 MG/ML IJ SOLN
0.5000 mg | INTRAMUSCULAR | Status: DC | PRN
  Administered 2014-10-31 – 2014-11-01 (×4): 1 mg via INTRAVENOUS
  Filled 2014-10-31 (×4): qty 1

## 2014-10-31 MED ORDER — PROPOFOL INFUSION 10 MG/ML OPTIME
INTRAVENOUS | Status: DC | PRN
  Administered 2014-10-31: 140 ug/kg/min via INTRAVENOUS

## 2014-10-31 MED ORDER — PROPOFOL 10 MG/ML IV BOLUS
INTRAVENOUS | Status: DC | PRN
  Administered 2014-10-31 (×2): 20 mg via INTRAVENOUS

## 2014-10-31 MED ORDER — NON FORMULARY: 20.0000 mg | Freq: Every day | Status: DC

## 2014-10-31 MED ORDER — CEFAZOLIN SODIUM-DEXTROSE 2-3 GM-% IV SOLR
2.0000 g | INTRAVENOUS | Status: AC
  Administered 2014-10-31: 2 g via INTRAVENOUS

## 2014-10-31 MED ORDER — KETOROLAC TROMETHAMINE 30 MG/ML IJ SOLN
INTRAMUSCULAR | Status: DC | PRN
  Administered 2014-10-31: 30 mg

## 2014-10-31 MED ORDER — LACTATED RINGERS IV SOLN: INTRAVENOUS | Status: DC

## 2014-10-31 MED ORDER — METHOCARBAMOL 500 MG PO TABS
500.0000 mg | ORAL_TABLET | Freq: Four times a day (QID) | ORAL | Status: DC | PRN
  Administered 2014-10-31 – 2014-11-01 (×3): 500 mg via ORAL
  Filled 2014-10-31 (×3): qty 1

## 2014-10-31 MED ORDER — ONDANSETRON HCL 4 MG/2ML IJ SOLN
INTRAMUSCULAR | Status: AC
  Filled 2014-10-31: qty 2

## 2014-10-31 MED ORDER — OMEPRAZOLE 20 MG PO CPDR
20.0000 mg | DELAYED_RELEASE_CAPSULE | Freq: Every day | ORAL | Status: DC
  Administered 2014-11-01: 20 mg via ORAL
  Filled 2014-10-31: qty 1

## 2014-10-31 MED ORDER — KETOROLAC TROMETHAMINE 30 MG/ML IJ SOLN
INTRAMUSCULAR | Status: AC
  Filled 2014-10-31: qty 1

## 2014-10-31 MED ORDER — FENTANYL CITRATE 0.05 MG/ML IJ SOLN
INTRAMUSCULAR | Status: DC | PRN
  Administered 2014-10-31: 100 ug via INTRAVENOUS

## 2014-10-31 MED ORDER — FENTANYL CITRATE 0.05 MG/ML IJ SOLN
INTRAMUSCULAR | Status: AC
  Filled 2014-10-31: qty 2

## 2014-10-31 MED ORDER — BUPIVACAINE-EPINEPHRINE (PF) 0.25% -1:200000 IJ SOLN
INTRAMUSCULAR | Status: AC
  Filled 2014-10-31: qty 30

## 2014-10-31 MED ORDER — MIDAZOLAM HCL 5 MG/5ML IJ SOLN
INTRAMUSCULAR | Status: DC | PRN
  Administered 2014-10-31: 2 mg via INTRAVENOUS

## 2014-10-31 MED ORDER — MAGNESIUM CITRATE PO SOLN: 1.0000 | Freq: Once | ORAL | Status: AC | PRN

## 2014-10-31 MED ORDER — SODIUM CHLORIDE 0.9 % IR SOLN
Status: DC | PRN
  Administered 2014-10-31: 1000 mL

## 2014-10-31 MED ORDER — POLYETHYLENE GLYCOL 3350 17 G PO PACK
17.0000 g | PACK | Freq: Two times a day (BID) | ORAL | Status: DC
  Administered 2014-10-31 – 2014-11-01 (×3): 17 g via ORAL

## 2014-10-31 MED ORDER — OXYCODONE HCL 5 MG PO TABS
5.0000 mg | ORAL_TABLET | ORAL | Status: DC
  Administered 2014-10-31: 15 mg via ORAL
  Administered 2014-10-31: 5 mg via ORAL
  Administered 2014-10-31: 10 mg via ORAL
  Administered 2014-11-01 (×2): 15 mg via ORAL
  Filled 2014-10-31: qty 2
  Filled 2014-10-31 (×2): qty 3
  Filled 2014-10-31: qty 1
  Filled 2014-10-31: qty 3

## 2014-10-31 MED ORDER — FESOTERODINE FUMARATE ER 8 MG PO TB24
8.0000 mg | ORAL_TABLET | Freq: Every day | ORAL | Status: DC
  Administered 2014-10-31 – 2014-11-01 (×2): 8 mg via ORAL
  Filled 2014-10-31 (×2): qty 1

## 2014-10-31 MED ORDER — METOCLOPRAMIDE HCL 5 MG/ML IJ SOLN: 5.0000 mg | Freq: Three times a day (TID) | INTRAMUSCULAR | Status: DC | PRN

## 2014-10-31 MED ORDER — MIDAZOLAM HCL 2 MG/2ML IJ SOLN
INTRAMUSCULAR | Status: AC
  Filled 2014-10-31: qty 2

## 2014-10-31 MED ORDER — BUPIVACAINE-EPINEPHRINE (PF) 0.25% -1:200000 IJ SOLN
INTRAMUSCULAR | Status: DC | PRN
  Administered 2014-10-31: 30 mL

## 2014-10-31 MED ORDER — CHLORHEXIDINE GLUCONATE 4 % EX LIQD
60.0000 mL | Freq: Once | CUTANEOUS | Status: DC
Start: 2014-10-31 — End: 2014-10-31

## 2014-10-31 MED ORDER — SALINE SPRAY 0.65 % NA SOLN
1.0000 | NASAL | Status: DC | PRN
  Filled 2014-10-31: qty 44

## 2014-10-31 MED ORDER — ONDANSETRON HCL 4 MG PO TABS: 4.0000 mg | ORAL_TABLET | Freq: Four times a day (QID) | ORAL | Status: DC | PRN

## 2014-10-31 MED ORDER — SODIUM CHLORIDE 0.9 % IJ SOLN
INTRAMUSCULAR | Status: AC
  Filled 2014-10-31: qty 50

## 2014-10-31 MED ORDER — EPHEDRINE SULFATE 50 MG/ML IJ SOLN
INTRAMUSCULAR | Status: DC | PRN
  Administered 2014-10-31 (×3): 10 mg via INTRAVENOUS

## 2014-10-31 MED ORDER — LACTATED RINGERS IV SOLN
INTRAVENOUS | Status: DC
  Administered 2014-10-31: 1000 mL via INTRAVENOUS
  Administered 2014-10-31: 11:00:00 via INTRAVENOUS

## 2014-10-31 MED ORDER — PHENOL 1.4 % MT LIQD
1.0000 | OROMUCOSAL | Status: DC | PRN
  Filled 2014-10-31: qty 177

## 2014-10-31 MED ORDER — HYDROMORPHONE HCL 1 MG/ML IJ SOLN: 0.2500 mg | INTRAMUSCULAR | Status: DC | PRN

## 2014-10-31 MED ORDER — SODIUM CHLORIDE 0.9 % IJ SOLN
INTRAMUSCULAR | Status: DC | PRN
  Administered 2014-10-31: 29 mL

## 2014-10-31 MED ORDER — SODIUM CHLORIDE 0.9 % IJ SOLN
INTRAMUSCULAR | Status: AC
  Filled 2014-10-31: qty 10

## 2014-10-31 MED ORDER — ONDANSETRON HCL 4 MG/2ML IJ SOLN
INTRAMUSCULAR | Status: DC | PRN
  Administered 2014-10-31: 4 mg via INTRAVENOUS

## 2014-10-31 MED ORDER — DEXAMETHASONE SODIUM PHOSPHATE 10 MG/ML IJ SOLN
10.0000 mg | Freq: Once | INTRAMUSCULAR | Status: AC
  Administered 2014-10-31: 10 mg via INTRAVENOUS

## 2014-10-31 MED ORDER — ONDANSETRON HCL 4 MG/2ML IJ SOLN
4.0000 mg | Freq: Four times a day (QID) | INTRAMUSCULAR | Status: DC | PRN
  Administered 2014-11-01: 4 mg via INTRAVENOUS
  Filled 2014-10-31: qty 2

## 2014-10-31 MED ORDER — ALUM & MAG HYDROXIDE-SIMETH 200-200-20 MG/5ML PO SUSP
30.0000 mL | ORAL | Status: DC | PRN
  Administered 2014-10-31: 30 mL via ORAL
  Filled 2014-10-31: qty 30

## 2014-10-31 MED ORDER — 0.9 % SODIUM CHLORIDE (POUR BTL) OPTIME
TOPICAL | Status: DC | PRN
  Administered 2014-10-31: 1000 mL

## 2014-10-31 MED ORDER — SODIUM CHLORIDE 0.9 % IV SOLN
INTRAVENOUS | Status: DC
  Administered 2014-10-31: 14:00:00 via INTRAVENOUS
  Filled 2014-10-31 (×5): qty 1000

## 2014-10-31 SURGICAL SUPPLY — 58 items
BAG ZIPLOCK 12X15 (MISCELLANEOUS) IMPLANT
BANDAGE ELASTIC 6 VELCRO ST LF (GAUZE/BANDAGES/DRESSINGS) ×2 IMPLANT
BANDAGE ESMARK 6X9 LF (GAUZE/BANDAGES/DRESSINGS) ×1 IMPLANT
BLADE SAW SGTL 13.0X1.19X90.0M (BLADE) ×2 IMPLANT
BNDG ESMARK 6X9 LF (GAUZE/BANDAGES/DRESSINGS) ×2
BOWL SMART MIX CTS (DISPOSABLE) ×2 IMPLANT
CAPT KNEE TOTAL 3 ATTUNE ×2 IMPLANT
CEMENT HV SMART SET (Cement) ×4 IMPLANT
CUFF TOURN SGL QUICK 34 (TOURNIQUET CUFF) ×1
CUFF TRNQT CYL 34X4X40X1 (TOURNIQUET CUFF) ×1 IMPLANT
DECANTER SPIKE VIAL GLASS SM (MISCELLANEOUS) ×2 IMPLANT
DRAPE EXTREMITY T 121X128X90 (DRAPE) ×2 IMPLANT
DRAPE POUCH INSTRU U-SHP 10X18 (DRAPES) ×2 IMPLANT
DRAPE U-SHAPE 47X51 STRL (DRAPES) ×2 IMPLANT
DRSG AQUACEL AG ADV 3.5X10 (GAUZE/BANDAGES/DRESSINGS) ×2 IMPLANT
DURAPREP 26ML APPLICATOR (WOUND CARE) ×4 IMPLANT
ELECT REM PT RETURN 9FT ADLT (ELECTROSURGICAL) ×2
ELECTRODE REM PT RTRN 9FT ADLT (ELECTROSURGICAL) ×1 IMPLANT
FACESHIELD WRAPAROUND (MASK) ×10 IMPLANT
GLOVE BIO SURGEON STRL SZ7 (GLOVE) ×2 IMPLANT
GLOVE BIO SURGEON STRL SZ8 (GLOVE) ×2 IMPLANT
GLOVE BIOGEL PI IND STRL 6.5 (GLOVE) ×1 IMPLANT
GLOVE BIOGEL PI IND STRL 7.0 (GLOVE) ×1 IMPLANT
GLOVE BIOGEL PI IND STRL 7.5 (GLOVE) ×1 IMPLANT
GLOVE BIOGEL PI IND STRL 8.5 (GLOVE) ×2 IMPLANT
GLOVE BIOGEL PI INDICATOR 6.5 (GLOVE) ×1
GLOVE BIOGEL PI INDICATOR 7.0 (GLOVE) ×1
GLOVE BIOGEL PI INDICATOR 7.5 (GLOVE) ×1
GLOVE BIOGEL PI INDICATOR 8.5 (GLOVE) ×2
GLOVE ECLIPSE 8.0 STRL XLNG CF (GLOVE) ×4 IMPLANT
GLOVE ORTHO TXT STRL SZ7.5 (GLOVE) ×4 IMPLANT
GOWN SPEC L3 XXLG W/TWL (GOWN DISPOSABLE) ×2 IMPLANT
GOWN STRL REUS W/TWL LRG LVL3 (GOWN DISPOSABLE) ×2 IMPLANT
GOWN STRL REUS W/TWL XL LVL3 (GOWN DISPOSABLE) ×4 IMPLANT
HANDPIECE INTERPULSE COAX TIP (DISPOSABLE) ×1
KIT BASIN OR (CUSTOM PROCEDURE TRAY) ×2 IMPLANT
LIQUID BAND (GAUZE/BANDAGES/DRESSINGS) ×2 IMPLANT
MANIFOLD NEPTUNE II (INSTRUMENTS) ×2 IMPLANT
NDL SAFETY ECLIPSE 18X1.5 (NEEDLE) ×1 IMPLANT
NEEDLE HYPO 18GX1.5 SHARP (NEEDLE) ×1
PACK TOTAL JOINT (CUSTOM PROCEDURE TRAY) ×2 IMPLANT
PEN SKIN MARKING BROAD (MISCELLANEOUS) ×2 IMPLANT
POSITIONER SURGICAL ARM (MISCELLANEOUS) ×2 IMPLANT
SET HNDPC FAN SPRY TIP SCT (DISPOSABLE) ×1 IMPLANT
SET PAD KNEE POSITIONER (MISCELLANEOUS) ×2 IMPLANT
SUCTION FRAZIER 12FR DISP (SUCTIONS) ×2 IMPLANT
SUT MNCRL AB 4-0 PS2 18 (SUTURE) ×2 IMPLANT
SUT VIC AB 1 CT1 36 (SUTURE) ×2 IMPLANT
SUT VIC AB 2-0 CT1 27 (SUTURE) ×3
SUT VIC AB 2-0 CT1 TAPERPNT 27 (SUTURE) ×3 IMPLANT
SUT VLOC 180 0 24IN GS25 (SUTURE) ×2 IMPLANT
SYR 50ML LL SCALE MARK (SYRINGE) ×2 IMPLANT
TOWEL OR 17X26 10 PK STRL BLUE (TOWEL DISPOSABLE) ×2 IMPLANT
TOWEL OR NON WOVEN STRL DISP B (DISPOSABLE) ×2 IMPLANT
TRAY FOLEY CATH 14FRSI W/METER (CATHETERS) ×2 IMPLANT
WATER STERILE IRR 1500ML POUR (IV SOLUTION) ×2 IMPLANT
WRAP KNEE MAXI GEL POST OP (GAUZE/BANDAGES/DRESSINGS) ×2 IMPLANT
YANKAUER SUCT BULB TIP 10FT TU (MISCELLANEOUS) ×2 IMPLANT

## 2014-10-31 NOTE — Anesthesia Procedure Notes (Signed)
Spinal Patient location during procedure: OR Start time: 10/31/2014 9:55 AM End time: 10/31/2014 10:10 AM Staffing Anesthesiologist: Rod Mae L Performed by: anesthesiologist  Preanesthetic Checklist Completed: patient identified, site marked, surgical consent, pre-op evaluation, timeout performed, IV checked, risks and benefits discussed and monitors and equipment checked Spinal Block Patient position: sitting Prep: Betadine Patient monitoring: heart rate, continuous pulse ox and blood pressure Approach: left paramedian Location: L3-4 Injection technique: single-shot Needle Needle type: Sprotte  Needle gauge: 24 G Needle length: 9 cm Assessment Sensory level: T8 Additional Notes Expiration date of kit checked and confirmed. Patient tolerated procedure well, without complications.

## 2014-10-31 NOTE — Interval H&P Note (Signed)
History and Physical Interval Note:  10/31/2014 8:40 AM  [name]  has presented today for surgery, with the diagnosis of OA RIGHT KNEE   The various methods of treatment have been discussed with the patient and family. After consideration of risks, benefits and other options for treatment, the patient has consented to  Procedure(s): TOTAL RIGHT KNEE ARTHROPLASTY (Right) as a surgical intervention .  The patient's history has been reviewed, patient examined, no change in status, stable for surgery.  I have reviewed the patient's chart and labs.  Questions were answered to the patient's satisfaction.     Shelda PalLIN,Brittan Mapel D

## 2014-10-31 NOTE — Anesthesia Preprocedure Evaluation (Addendum)
Anesthesia Evaluation  Patient identified by MRN, date of birth, ID band Patient awake    Reviewed: Allergy & Precautions, H&P , NPO status , Patient's Chart, lab work & pertinent test results  History of Anesthesia Complications (+) PONVNegative for: history of anesthetic complications  Airway Mallampati: II  TM Distance: >3 FB Neck ROM: Full    Dental  (+) Dental Advisory Given, Edentulous Upper, Missing All side teeth missing lower:   Pulmonary neg pulmonary ROS,  breath sounds clear to auscultation  Pulmonary exam normal       Cardiovascular negative cardio ROS  Rhythm:Regular Rate:Normal     Neuro/Psych negative neurological ROS  negative psych ROS   GI/Hepatic Neg liver ROS, GERD-  Medicated,  Endo/Other  negative endocrine ROS  Renal/GU negative Renal ROS     Musculoskeletal negative musculoskeletal ROS (+)   Abdominal   Peds  Hematology negative hematology ROS (+)   Anesthesia Other Findings   Reproductive/Obstetrics negative OB ROS                            Anesthesia Physical Anesthesia Plan  ASA: II  Anesthesia Plan: Spinal   Post-op Pain Management:    Induction:   Airway Management Planned: Simple Face Mask  Additional Equipment:   Intra-op Plan:   Post-operative Plan:   Informed Consent:   Plan Discussed with: Surgeon  Anesthesia Plan Comments:         Anesthesia Quick Evaluation

## 2014-10-31 NOTE — Anesthesia Postprocedure Evaluation (Signed)
  Anesthesia Post-op Note  Patient: Kathy Jennings  Procedure(s) Performed: Procedure(s) (LRB): TOTAL RIGHT KNEE ARTHROPLASTY (Right)  Patient Location: PACU  Anesthesia Type: Spinal  Level of Consciousness: awake and alert   Airway and Oxygen Therapy: Patient Spontanous Breathing  Post-op Pain: mild  Post-op Assessment: Post-op Vital signs reviewed, Patient's Cardiovascular Status Stable, Respiratory Function Stable, Patent Airway and No signs of Nausea or vomiting  Last Vitals:  Filed Vitals:   10/31/14 1245  BP: 141/62  Pulse: 64  Temp: 36.4 C  Resp: 20    Post-op Vital Signs: stable   Complications: No apparent anesthesia complications

## 2014-10-31 NOTE — Transfer of Care (Signed)
Immediate Anesthesia Transfer of Care Note  Patient: Kathy Jennings  Procedure(s) Performed: Procedure(s): TOTAL RIGHT KNEE ARTHROPLASTY (Right)  Patient Location: PACU  Anesthesia Type:Spinal  Level of Consciousness: awake, alert  and oriented  Airway & Oxygen Therapy: Patient Spontanous Breathing and Patient connected to face mask oxygen  Post-op Assessment: Report given to RN and Post -op Vital signs reviewed and stable  Post vital signs: Reviewed and stable  Last Vitals:  Filed Vitals:   10/31/14 0656  BP: 154/79  Pulse: 62  Temp: 36.8 C  Resp: 16    Complications: No apparent anesthesia complications

## 2014-10-31 NOTE — Evaluation (Signed)
Physical Therapy Evaluation Patient Details Name: Kathy Jennings MRN: [id number] DOB: 09/21/1950 Today's Date: 10/31/2014   History of Present Illness  RTKA  Clinical Impression  Patient ambulated x 7560'  Patient will benefit from PT to address problems listed in note below.    Follow Up Recommendations Home health PT;Supervision/Assistance - 24 hour    Equipment Recommendations  None recommended by PT    Recommendations for Other Services       Precautions / Restrictions Precautions Precautions: Fall;Knee      Mobility  Bed Mobility Overal bed mobility: Needs Assistance Bed Mobility: Supine to Sit     Supine to sit: Min assist     General bed mobility comments: support  R leg  off of bed.  Transfers Overall transfer level: Needs assistance Equipment used: Rolling walker (2 wheeled) Transfers: Sit to/from Stand Sit to Stand: Min assist         General transfer comment: cues for hand and R leg positiobn  Ambulation/Gait Ambulation/Gait assistance: Min assist Ambulation Distance (Feet): 60 Feet Assistive device: Rolling walker (2 wheeled) Gait Pattern/deviations: Step-to pattern;Step-through pattern;Antalgic     General Gait Details: cues for sequence  Stairs            Wheelchair Mobility    Modified Rankin (Stroke Patients Only)       Balance                                             Pertinent Vitals/Pain Pain Assessment: 0-10 Pain Score: 6  Pain Location: R knee Pain Descriptors / Indicators: Aching Pain Intervention(s): Limited activity within patient's tolerance;Monitored during session;Patient requesting pain meds-RN notified;Ice applied    Home Living Family/patient expects to be discharged to:: Private residence Living Arrangements: Spouse/significant other Available Help at Discharge: Family Type of Home: House Home Access: Stairs to enter Entrance Stairs-Rails: None Entrance Stairs-Number of Steps:  3 Home Layout: One level Home Equipment: Environmental consultantWalker - 2 wheels;Cane - single point      Prior Function Level of Independence: Independent with assistive device(s)               Hand Dominance        Extremity/Trunk Assessment               Lower Extremity Assessment: RLE deficits/detail RLE Deficits / Details: able to perform SLR    Cervical / Trunk Assessment: Normal  Communication   Communication: No difficulties  Cognition Arousal/Alertness: Awake/alert Behavior During Therapy: WFL for tasks assessed/performed Overall Cognitive Status: Within Functional Limits for tasks assessed                      General Comments      Exercises        Assessment/Plan    PT Assessment Patient needs continued PT services  PT Diagnosis Difficulty walking;Acute pain   PT Problem List Decreased strength;Decreased range of motion;Decreased activity tolerance;Decreased mobility;Decreased knowledge of precautions;Decreased safety awareness;Decreased knowledge of use of DME;Pain  PT Treatment Interventions DME instruction;Gait training;Stair training;Functional mobility training;Therapeutic activities;Therapeutic exercise;Patient/family education   PT Goals (Current goals can be found in the Care Plan section) Acute Rehab PT Goals Patient Stated Goal: to walk without pain, go back to work PT Goal Formulation: With patient Time For Goal Achievement: 11/04/14 Potential to Achieve Goals: Good    Frequency 7X/week  Barriers to discharge        Co-evaluation               End of Session   Activity Tolerance: Patient tolerated treatment well Patient left: in chair;with call bell/phone within reach Nurse Communication: Mobility status;Patient requests pain meds         Time: 1630-1650 PT Time Calculation (min) (ACUTE ONLY): 20 min   Charges:   PT Evaluation $Initial PT Evaluation Tier I: 1 Procedure     PT G CodesRada Hay 10/31/2014, 5:59 PM Blanchard Kelch PT [phone]

## 2014-10-31 NOTE — Op Note (Signed)
NAME:  Kathy Jennings                      MEDICAL RECORD NO.:  [id number]                             [facility]      PHYSICIAN:  Madlyn FrankelMatthew D. Charlann Boxerlin, M.D.  DATE OF BIRTH:  04-14-1951      DATE OF PROCEDURE:  10/31/2014                                     OPERATIVE REPORT         PREOPERATIVE DIAGNOSIS:  Right knee osteoarthritis.      POSTOPERATIVE DIAGNOSIS:  Right knee osteoarthritis.      FINDINGS:  The patient was noted to have complete loss of cartilage and   bone-on-bone arthritis with associated osteophytes in the lateral and patellofemoral compartments of   the knee with a significant synovitis and associated effusion.      PROCEDURE:  Right total knee replacement.      COMPONENTS USED:  DePuy Attune rotating platform posterior stabilized knee   system, a size 3 femur, 4 tibia, size 6 mm PS AOX insert, and 32 anatomic patellar   button.      SURGEON:  Madlyn FrankelMatthew D. Charlann Boxerlin, M.D.      ASSISTANT:  Lanney GinsMatthew Babish, PA-C.      ANESTHESIA:  Spinal.      SPECIMENS:  None.      COMPLICATION:  None.      DRAINS:  None.  EBL: <100cc      TOURNIQUET TIME:   Total Tourniquet Time Documented: Thigh (Right) - 36 minutes Total: Thigh (Right) - 36 minutes  .      The patient was stable to the recovery room.      INDICATION FOR PROCEDURE:  Kathy BetterKathy A Meissner is a 64 y.o. female patient of   mine.  The patient had been seen, evaluated, and treated conservatively in the   office with medication, activity modification, and injections.  The patient had   radiographic changes of bone-on-bone arthritis with endplate sclerosis and osteophytes noted.      The patient failed conservative measures including medication, injections, and activity modification, and at this point was ready for more definitive measures.   Based on the radiographic changes and failed conservative measures, the patient   decided to proceed with total knee replacement.  Risks of infection,   DVT, component  failure, need for revision surgery, postop course, and   expectations were all   discussed and reviewed.  Consent was obtained for benefit of pain   relief.      PROCEDURE IN DETAIL:  The patient was brought to the operative theater.   Once adequate anesthesia, preoperative antibiotics, 2 gm of Ancef, 1gm of Tranexamic Acid, and 10mg  of Decadron administered, the patient was positioned supine with the right thigh tourniquet placed.  The  right lower extremity was prepped and draped in sterile fashion.  A time-   out was performed identifying the patient, planned procedure, and   extremity.      The right lower extremity was [facility] in the [facility] leg holder.  The leg was   exsanguinated, tourniquet elevated to 250 mmHg.  A midline incision was   made followed by  median parapatellar arthrotomy.  Following initial   exposure, attention was first directed to the patella.  Precut   measurement was noted to be 21 mm.  I resected down to 14 mm and used a   32 patellar button to restore patellar height as well as cover the cut   surface.      The lug holes were drilled and a metal shim was placed to protect the   patella from retractors and saw blades.      At this point, attention was now directed to the femur.  The femoral   canal was opened with a drill, irrigated to try to prevent fat emboli.  An   intramedullary rod was passed at 3 degrees valgus, 8 mm of bone was   resected off the distal femur due to pre-operatively recognized hyper-extension.  Following this resection, the tibia was   subluxated anteriorly.  Using the extramedullary guide, 6 mm of bone was resected off   the proximal lateral tibia.  We confirmed the gap would be   stable medially and laterally with a 5 mm insert as well as confirmed   the cut was perpendicular in the coronal plane, checking with an alignment rod.      Once this was done, I sized the femur to be a size 3 in the anterior-   posterior dimension, chose a  standard component based on medial and   lateral dimension.  The size 3 rotation block was then pinned in   position anterior referenced using the C-clamp to set rotation.  The   anterior, posterior, and  chamfer cuts were made without difficulty nor   notching making certain that I was along the anterior cortex to help   with flexion gap stability.      The final box cut was made off the lateral aspect of distal femur.      At this point, the tibia was sized to be a size 4, the size 4 tray was   then pinned in position through the medial third of the tubercle,   drilled, and keel punched.  Trial reduction was now carried with a 3 femur,  4 tibia, a size 6 mm insert, and the 32 patella botton.  The knee was brought to   extension, full extension with good flexion stability with the patella   tracking through the trochlea without application of pressure.  Given   all these findings, the trial components removed.  Final components were   opened and cement was mixed.  The knee was irrigated with normal saline   solution and pulse lavage.  The synovial lining was   then injected with 30cc of 0.25% Marcaine with epinephrine and 1 cc of Toradol plus 30cc of NS for a   total of 61 cc.      The knee was irrigated.  Final implants were then cemented onto clean and   dried cut surfaces of bone with the knee brought to extension with a 6 mm trial insert.      Once the cement had fully cured, the excess cement was removed   throughout the knee.  I confirmed I was satisfied with the range of   motion and stability, and the final size 6 mm PS AOX insert was chosen.  It was   placed into the knee.      The tourniquet had been let down at 35 minutes.  No significant   hemostasis required.  The  extensor mechanism was then reapproximated using #1 Vicryl and #0 V-lock sutures with the knee   in flexion.  The   remaining wound was closed with 2-0 Vicryl and running 4-0 Monocryl.   The knee was  cleaned, dried, dressed sterilely using Dermabond and   Aquacel dressing.  The patient was then   brought to recovery room in stable condition, tolerating the procedure   well.   Please note that Physician Assistant, Lanney Gins, PA-C, was present for the entirety of the case, and was utilized for pre-operative positioning, peri-operative retractor management, general facilitation of the procedure.  He was also utilized for primary wound closure at the end of the case.              Madlyn Frankel Charlann Boxer, M.D.    10/31/2014 11:40 AM

## 2014-11-01 LAB — CBC
HCT: 31.7 % — ABNORMAL LOW (ref 36.0–46.0)
HEMOGLOBIN: 10.3 g/dL — AB (ref 12.0–15.0)
MCH: 27.2 pg (ref 26.0–34.0)
MCHC: 32.5 g/dL (ref 30.0–36.0)
MCV: 83.6 fL (ref 78.0–100.0)
Platelets: 196 10*3/uL (ref 150–400)
RBC: 3.79 MIL/uL — ABNORMAL LOW (ref 3.87–5.11)
RDW: 16.3 % — ABNORMAL HIGH (ref 11.5–15.5)
WBC: 7.3 10*3/uL (ref 4.0–10.5)

## 2014-11-01 LAB — BASIC METABOLIC PANEL
Anion gap: 6 (ref 5–15)
BUN: 22 mg/dL (ref 6–23)
CHLORIDE: 105 mmol/L (ref 96–112)
CO2: 27 mmol/L (ref 19–32)
CREATININE: 0.55 mg/dL (ref 0.50–1.10)
Calcium: 9 mg/dL (ref 8.4–10.5)
GFR calc Af Amer: >90 mL/min (ref >90)
Glucose, Bld: 128 mg/dL — ABNORMAL HIGH (ref 70–99)
POTASSIUM: 4.1 mmol/L (ref 3.5–5.1)
Sodium: 138 mmol/L (ref 135–145)

## 2014-11-01 MED ORDER — OXYCODONE HCL 5 MG PO TABS: 5.0000 mg | ORAL_TABLET | ORAL | Status: DC | PRN

## 2014-11-01 MED ORDER — ASPIRIN 325 MG PO TBEC: 325.0000 mg | DELAYED_RELEASE_TABLET | Freq: Two times a day (BID) | ORAL | Status: AC

## 2014-11-01 MED ORDER — DOCUSATE SODIUM 100 MG PO CAPS: 100.0000 mg | ORAL_CAPSULE | Freq: Two times a day (BID) | ORAL | Status: DC

## 2014-11-01 MED ORDER — FERROUS SULFATE 325 (65 FE) MG PO TABS: 325.0000 mg | ORAL_TABLET | Freq: Three times a day (TID) | ORAL | Status: DC

## 2014-11-01 MED ORDER — POLYETHYLENE GLYCOL 3350 17 G PO PACK: 17.0000 g | PACK | Freq: Two times a day (BID) | ORAL | Status: DC

## 2014-11-01 MED ORDER — METHOCARBAMOL 500 MG PO TABS: 500.0000 mg | ORAL_TABLET | Freq: Four times a day (QID) | ORAL | Status: DC | PRN

## 2014-11-01 MED ORDER — OXYCODONE HCL 5 MG PO TABS
5.0000 mg | ORAL_TABLET | ORAL | Status: DC | PRN
  Administered 2014-11-01: 5 mg via ORAL
  Administered 2014-11-01: 15 mg via ORAL
  Administered 2014-11-01: 10 mg via ORAL
  Filled 2014-11-01: qty 2
  Filled 2014-11-01: qty 3
  Filled 2014-11-01: qty 1

## 2014-11-01 NOTE — Progress Notes (Signed)
Physical Therapy Treatment Patient Details Name: Hughes BetterKathy A Highbaugh MRN: [id number] DOB: 25-Sep-1950 Today's Date: 11/01/2014    History of Present Illness RTKA    PT Comments    Patient reports feeling better after episode of  N/V. Patient practiced  Steps, reviewed exercises. Ready for DC. To get leg lifter.  Follow Up Recommendations  Home health PT;Supervision/Assistance - 24 hour     Equipment Recommendations       Recommendations for Other Services       Precautions / Restrictions Precautions Precautions: Fall;Knee    Mobility  Bed Mobility                  Transfers     Transfers: Sit to/from Stand Sit to Stand: Min guard         General transfer comment: cues for R leg position  Ambulation/Gait Ambulation/Gait assistance: Min guard Ambulation Distance (Feet): 60 Feet Assistive device: Rolling walker (2 wheeled)       General Gait Details: cues for sequence   Stairs Stairs: Yes Stairs assistance: Min assist Stair Management: No rails;Step to pattern;Backwards;With walker Number of Stairs: 2 General stair comments: cues for sequence  Wheelchair Mobility    Modified Rankin (Stroke Patients Only)       Balance                                    Cognition Arousal/Alertness: Awake/alert                          Exercises      General Comments        Pertinent Vitals/Pain Pain Score: 4  Pain Location: R knee Pain Descriptors / Indicators: Aching Pain Intervention(s): Limited activity within patient's tolerance;Patient requesting pain meds-RN notified    Home Living                      Prior Function            PT Goals (current goals can now be found in the care plan section) Progress towards PT goals: Progressing toward goals    Frequency       PT Plan Current plan remains appropriate    Co-evaluation             End of Session   Activity Tolerance: Patient tolerated  treatment well Patient left: in chair;with call bell/phone within reach;with nursing/sitter in room;with family/visitor present     Time: [id number] PT Time Calculation (min) (ACUTE ONLY): 19 min  Charges:  $Gait Training: 8-22 mins                    G Codes:      Rada HayHill, Fredericka Bottcher Elizabeth 11/01/2014, 3:30 PM

## 2014-11-01 NOTE — Care Management Note (Signed)
    Page 1 of 1   11/01/2014     1:22:02 PM CARE MANAGEMENT NOTE 11/01/2014  Patient:  Kathy Jennings, Kathy Jennings   Account Number:  [id number]  Date Initiated:  11/01/2014  Documentation initiated by:  [facility]  Subjective/Objective Assessment:   adm: TOTAL RIGHT KNEE ARTHROPLASTY (Right)     Action/Plan:   discharge planning   Anticipated DC Date:  11/01/2014   Anticipated DC Plan:  [place]  CM consult      [facility] Choice  HOME HEALTH   Choice offered to / List presented to:          [facility] arranged  HH-2 PT      Status of service:  Completed, signed off Medicare Important Message given?   (If response is "NO", the following Medicare IM given date fields will be blank) Date Medicare IM given:   Medicare IM given by:   Date Additional Medicare IM given:   Additional Medicare IM given by:    Discharge Disposition:  [place]  Per UR Regulation:    If discussed at Long Length of Stay Meetings, dates discussed:    Comments:  11/01/14 13:15 CM met with pt in room to offer choice of home health agency.  Pt chooses [facility] to render HHPT.  Address and contact information verified by pt.  Pt has both a 3n1 and rolling walker at home.  Referral emailed to [employer], Tim.  NO other Cm needs were communicatred.  Mariane Masters, BSN, CM [phone].

## 2014-11-01 NOTE — Progress Notes (Signed)
Physical Therapy Treatment Patient Details Name: Kathy Jennings MRN: [id number] DOB: [birth year] Today's Date: 11/01/2014    History of Present Illness RTKA    PT Comments    Patient tolerated exercises and ambulation. Plans Dc this PM after PT. Pt claims being very sleepy.  Follow Up Recommendations        Equipment Recommendations  None recommended by PT    Recommendations for Other Services       Precautions / Restrictions Precautions Precautions: Fall;Knee Restrictions Weight Bearing Restrictions: No    Mobility  Bed Mobility Overal bed mobility: Needs Assistance Bed Mobility: Supine to Sit;Sit to Supine     Supine to sit: Min assist Sit to supine: Min assist   General bed mobility comments: support  R leg  off of bed., used L foot to support R onto bed,  Transfers Overall transfer level: Needs assistance Equipment used: Rolling walker (2 wheeled)   Sit to Stand: Min assist         General transfer comment: practiced from high bed, able toweight shift and scoot onto bed without assistance  Ambulation/Gait Ambulation/Gait assistance: Min guard Ambulation Distance (Feet): 60 Feet Assistive device: Rolling walker (2 wheeled) Gait Pattern/deviations: Step-to pattern;Step-through pattern;Antalgic     General Gait Details: cues for sequence   Stairs            Wheelchair Mobility    Modified Rankin (Stroke Patients Only)       Balance                                    Cognition Arousal/Alertness: Awake/alert                          Exercises Total Joint Exercises Ankle Circles/Pumps: AROM;Both;10 reps;Supine Quad Sets: AROM;Both;10 reps;Supine Heel Slides: AAROM;Right;10 reps;Supine Hip ABduction/ADduction: AAROM;Right;10 reps;Supine Straight Leg Raises: AAROM;Right;10 reps;Supine Goniometric ROM: 5-60 R knee    General Comments        Pertinent Vitals/Pain Pain Score: 4  Pain Location: R  knee Pain Descriptors / Indicators: Aching Pain Intervention(s): Monitored during session;Premedicated before session;Ice applied    Home Living                      Prior Function            PT Goals (current goals can now be found in the care plan section) Progress towards PT goals: Progressing toward goals    Frequency  7X/week    PT Plan Current plan remains appropriate    Co-evaluation             End of Session   Activity Tolerance: Patient tolerated treatment well Patient left: in bed;with call bell/phone within reach     Time: 0832-0916 PT Time Calculation (min) (ACUTE ONLY): 44 min  Charges:  $Gait Training: 8-22 mins $Therapeutic Exercise: 8-22 mins $Self Care/Home Management: 8-22                    G Codes:      Rada HayHill, Jazzelle Zhang Elizabeth 11/01/2014, 9:24 AM Blanchard KelchKaren Levern Kalka PT [phone]

## 2014-11-01 NOTE — Evaluation (Signed)
Occupational Therapy Evaluation Patient Details Name: Kathy Jennings MRN: [id number] DOB: February 13, 1951 Today's Date: 11/01/2014    History of Present Illness RTKA   Clinical Impression   Education complete    Follow Up Recommendations  No OT follow up    Equipment Recommendations  None recommended by OT    Recommendations for Other Services       Precautions / Restrictions Precautions Precautions: Fall;Knee Restrictions Weight Bearing Restrictions: No      Mobility Bed Mobility Overal bed mobility: Needs Assistance Bed Mobility: Supine to Sit;Sit to Supine     Supine to sit: Min assist Sit to supine: Min assist   General bed mobility comments: support  R leg  off of bed., used L foot to support R onto bed,  Transfers Overall transfer level: Needs assistance Equipment used: Rolling walker (2 wheeled) Transfers: Sit to/from Stand Sit to Stand: Min assist         General transfer comment: practiced from high bed, able toweight shift and scoot onto bed without assistance    Balance                                            ADL Overall ADL's : Needs assistance/impaired                     Lower Body Dressing: Supervision/safety;Sit to/from stand   Toilet Transfer: Supervision/safety;RW;Ambulation   Toileting- Architect and Hygiene: Supervision/safety;Sit to/from stand       Functional mobility during ADLs: Supervision/safety;Rolling walker                 Pertinent Vitals/Pain Pain Score: 4  Pain Location: R knee Pain Descriptors / Indicators: Aching Pain Intervention(s): Monitored during session;Premedicated before session;Ice applied     Hand Dominance     Extremity/Trunk Assessment Upper Extremity Assessment Upper Extremity Assessment: Overall WFL for tasks assessed           Communication Communication Communication: No difficulties   Cognition Arousal/Alertness: Awake/alert Behavior  During Therapy: WFL for tasks assessed/performed Overall Cognitive Status: Within Functional Limits for tasks assessed                                Home Living Family/patient expects to be discharged to:: Private residence Living Arrangements: Spouse/significant other Available Help at Discharge: Family Type of Home: House Home Access: Stairs to enter Secretary/administrator of Steps: 3 Entrance Stairs-Rails: None Home Layout: One level     Bathroom Shower/Tub: Chief Strategy Officer: Handicapped height     Home Equipment: Environmental consultant - 2 wheels;Cane - single point          Prior Functioning/Environment Level of Independence: Independent with assistive device(s)                      OT Goals(Current goals can be found in the care plan section) Acute Rehab OT Goals Patient Stated Goal: to walk without pain, go back to work OT Goal Formulation: With patient Time For Goal Achievement: 11/08/14 Potential to Achieve Goals: Good  OT Frequency:     Barriers to D/C:            Co-evaluation              End of Session  Activity Tolerance: Patient tolerated treatment well Patient left: in chair   Time: 1000-1029 OT Time Calculation (min): 29 min Charges:  OT General Charges $OT Visit: 1 Procedure OT Evaluation $Initial OT Evaluation Tier I: 1 Procedure OT Treatments $Self Care/Home Management : 8-22 mins G-Codes:    Einar CrowEDDING, Cherysh Epperly D 11/01/2014, 10:34 AM

## 2014-11-06 NOTE — Progress Notes (Signed)
     Subjective: 1 Day Post-Op Procedure(s) (LRB): TOTAL RIGHT KNEE ARTHROPLASTY (Right)   Patient reports pain as mild, pain controlled well. No events throughout the night. Ready to be discharged home.  Objective:   VITALS:   Filed Vitals:   11/01/14   BP: 133/58  Pulse: 65  Temp: 98.5 F (36.9 C)  Resp: 20    Dorsiflexion/Plantar flexion intact Incision: dressing C/D/I No cellulitis present Compartment soft  LABS   LABS  Basename    HGB  10.3  HCT  31.7        Assessment/Plan: 1 Day Post-Op Procedure(s) (LRB): TOTAL RIGHT KNEE ARTHROPLASTY (Right) Foley cath d/c'ed Advance diet Up with therapy D/C IV fluids Discharge home with home health [facility]. Follow up with OLIN,Alice Vitelli D in 2 weeks.  Contact information:  [facility] [address], [address] [place] [id number] [phone]       Anastasio AuerbachMatthew S. Mekayla Soman   PAC  11/06/2014, 2:38 PM

## 2014-11-06 NOTE — Discharge Summary (Signed)
Physician Discharge Summary  Patient ID: Kathy Jennings MRN: [id number] DOB/AGE: 05/24/1951 64 y.o.  Admit date: 10/31/2014 Discharge date: 11/01/2014   Procedures:  Procedure(s) (LRB): TOTAL RIGHT KNEE ARTHROPLASTY (Right)  Attending Physician:  Dr. Durene Romans   Admission Diagnoses:   Right knee primary OA / pain  Discharge Diagnoses:  Principal Problem:   S/P right TKA Active Problems:   S/P knee replacement  Past Medical History  Diagnosis Date  . Complication of anesthesia     slow to wake up  . Arthritis   . GERD (gastroesophageal reflux disease)   . OAB (overactive bladder)   . Urgency of urination   . Varicose veins     HPI:    Kathy Jennings, 64 y.o. female, has a history of pain and functional disability in the right knee due to arthritis and has failed non-surgical conservative treatments for greater than 12 weeks to include NSAID's and/or analgesics, corticosteriod injections, supervised PT with diminished ADL's post treatment, use of assistive devices and activity modification. Onset of symptoms was gradual, starting ~1 years ago with gradually worsening course since that time. The patient noted no past surgery on the right knee(s). Patient currently rates pain in the right knee(s) at 8 out of 10 with activity. Patient has night pain, worsening of pain with activity and weight bearing, pain that interferes with activities of daily living, pain with passive range of motion, crepitus and joint swelling. Patient has evidence of periarticular osteophytes and joint space narrowing by imaging studies. There is no active infection. Risks, benefits and expectations were discussed with the patient. Risks including but not limited to the risk of anesthesia, blood clots, nerve damage, blood vessel damage, failure of the prosthesis, infection and up to and including death. Patient understand the risks, benefits and expectations and wishes to proceed with surgery.   PCP:  Estanislado Pandy, MD   Discharged Condition: good  Hospital Course:  Patient underwent the above stated procedure on 10/31/2014. Patient tolerated the procedure well and brought to the recovery room in good condition and subsequently to the floor.  POD #1 BP: 133/58 ; Pulse: 65 ; Temp: 98.5 F (36.9 C) ; Resp: 20 Patient reports pain as mild, pain controlled well. No events throughout the night. Ready to be discharged home. Dorsiflexion/plantar flexion intact, incision: dressing C/D/I, no cellulitis present and compartment soft.   LABS  Basename    HGB  10.3  HCT  31.7    Discharge Exam: General appearance: alert, cooperative and no distress Extremities: Homans sign is negative, no sign of DVT, no edema, redness or tenderness in the calves or thighs and no ulcers, gangrene or trophic changes  Disposition: Home with follow up in 2 weeks   Follow-up Information    Follow up with Shelda Pal, MD.   Specialty:  Orthopedic Surgery   Contact information:   [address] [place] [address] [phone]       Follow up with [facility].   Why:  home health physical therapy   Contact information:   [address] [place] [address] [phone]       Discharge Instructions    Call MD / Call 911    Complete by:  As directed   If you experience chest pain or shortness of breath, CALL 911 and be transported to the hospital emergency room.  If you develope a fever above 101 F, pus (white drainage) or increased drainage or  redness at the wound, or calf pain, call your surgeon's office.     Change dressing    Complete by:  As directed   Maintain surgical dressing until follow up in the clinic. If the edges start to pull up, may reinforce with tape. If the dressing is no longer working, may remove and cover with gauze and tape, but must keep the area dry and clean.  Call with any questions or concerns.     Constipation Prevention    Complete  by:  As directed   Drink plenty of fluids.  Prune juice may be helpful.  You may use a stool softener, such as Colace (over the counter) 100 mg twice a day.  Use MiraLax (over the counter) for constipation as needed.     Diet - low sodium heart healthy    Complete by:  As directed      Discharge instructions    Complete by:  As directed   Maintain surgical dressing until follow up in the clinic. If the edges start to pull up, may reinforce with tape. If the dressing is no longer working, may remove and cover with gauze and tape, but must keep the area dry and clean.  [facility]. Call with any questions or concerns.     Driving restrictions    Complete by:  As directed   No driving for 4 weeks     Increase activity slowly as tolerated    Complete by:  As directed      TED hose    Complete by:  As directed   Use stockings (TED hose) for 2 weeks on both leg(s).  You may remove them at night for sleeping.     Weight bearing as tolerated    Complete by:  As directed   Laterality:  right  Extremity:  Lower             Medication List    STOP taking these medications        naproxen 500 MG tablet  Commonly known as:  NAPROSYN      TAKE these medications        acetaminophen 500 MG tablet  Commonly known as:  TYLENOL  Take 500 mg by mouth every 6 (six) hours as needed for mild pain.     aspirin 325 MG EC tablet  Take 1 tablet (325 mg total) by mouth 2 (two) times daily.     Bee Pollen 550 MG Caps  Take 1 capsule by mouth daily.     docusate sodium 100 MG capsule  Commonly known as:  COLACE  Take 1 capsule (100 mg total) by mouth 2 (two) times daily.     ferrous sulfate 325 (65 FE) MG tablet  Take 1 tablet (325 mg total) by mouth 3 (three) times daily after meals.     methocarbamol 500 MG tablet  Commonly known as:  ROBAXIN  Take 1 tablet (500 mg total) by mouth every 6 (six) hours as needed for muscle spasms.     multivitamin with  minerals Tabs tablet  Take 1 tablet by mouth daily.     Omega 3 1200 MG Caps  Take 1 capsule by mouth 2 (two) times daily.     omeprazole 20 MG capsule  Commonly known as:  PRILOSEC  Take 20 mg by mouth daily.     oxyCODONE 5 MG immediate release tablet  Commonly known as:  Oxy IR/ROXICODONE  Take 1-3 tablets (5-15 mg total) by mouth every 3 (three) hours as needed for moderate pain or severe pain.     polyethylene glycol packet  Commonly known as:  MIRALAX / GLYCOLAX  Take 17 g by mouth 2 (two) times daily.     sodium chloride 0.65 % Soln nasal spray  Commonly known as:  OCEAN  Place 1 spray into both nostrils as needed for congestion.     tolterodine 4 MG 24 hr capsule  Commonly known as:  DETROL LA  Take 4 mg by mouth daily.     Vitamin D 2000 UNITS tablet  Take 2,000 Units by mouth daily.         Signed: Anastasio Auerbach. Chayim Bialas   PA-C  11/06/2014, 2:36 PM

## 2017-03-25 NOTE — Patient Instructions (Signed)
Hughes BetterKathy A Herdt  03/25/2017   Your procedure is scheduled on:   03/31/2017    [facility] .  Report to admitting at    12 noon   Call this number if you have problems the morning of surgery  [phone]   Remember: ONLY 1 PERSON MAY GO WITH YOU TO SHORT STAY TO GET  READY MORNING OF YOUR SURGERY.  Do not eat food  After midnite.  May have clear liquids from 12 midnite until 0800am morning of surgery then nothing by mouth. .     Take these medicines the morning of surgery with A SIP OF WATER: Claritin if needed, Prilosec, ocean nasal spray if needed                                 You may not have any metal on your body including hair pins and              piercings  Do not wear jewelry, make-up, lotions, powders or perfumes, deodorant             Do not wear nail polish.  Do not shave  48 hours prior to surgery.     Do not bring valuables to the hospital. [place] IS NOT             RESPONSIBLE   FOR VALUABLES.  Contacts, dentures or bridgework may not be worn into surgery.  Leave suitcase in the car. After surgery it may be brought to your room.                   Please read over the following fact sheets you were given: _____________________________________________________________________             [facility] - Preparing for Surgery Before surgery, you can play an important role.  Because skin is not sterile, your skin needs to be as free of germs as possible.  You can reduce the number of germs on your skin by washing with CHG (chlorahexidine gluconate) soap before surgery.  CHG is an antiseptic cleaner which kills germs and bonds with the skin to continue killing germs even after washing. Please DO NOT use if you have an allergy to CHG or antibacterial soaps.  If your skin becomes reddened/irritated stop using the CHG and inform your nurse when you arrive at Short Stay. Do not shave (including legs and underarms) for  at least 48 hours prior to the first CHG shower.  You may shave your face/neck. Please follow these instructions carefully:  1.  Shower with CHG Soap the night before surgery and the  morning of Surgery.  2.  If you choose to wash your hair, wash your hair first as usual with your  normal  shampoo.  3.  After you shampoo, rinse your hair and body thoroughly to remove the  shampoo.                           4.  Use CHG as you would any other liquid soap.  You can apply chg directly  to the skin and wash                       Gently with  a scrungie or clean washcloth.  5.  Apply the CHG Soap to your body ONLY FROM THE NECK DOWN.   Do not use on face/ open                           Wound or open sores. Avoid contact with eyes, ears mouth and genitals (private parts).                       Wash face,  Genitals (private parts) with your normal soap.             6.  Wash thoroughly, paying special attention to the area where your surgery  will be performed.  7.  Thoroughly rinse your body with warm water from the neck down.  8.  DO NOT shower/wash with your normal soap after using and rinsing off  the CHG Soap.                9.  Pat yourself dry with a clean towel.            10.  Wear clean pajamas.            11.  Place clean sheets on your bed the night of your first shower and do not  sleep with pets. Day of Surgery : Do not apply any lotions/deodorants the morning of surgery.  Please wear clean clothes to the hospital/surgery center.  FAILURE TO FOLLOW THESE INSTRUCTIONS MAY RESULT IN THE CANCELLATION OF YOUR SURGERY PATIENT SIGNATURE_________________________________  NURSE SIGNATURE__________________________________  ________________________________________________________________________  WHAT IS A BLOOD TRANSFUSION? Blood Transfusion Information  A transfusion is the replacement of blood or some of its parts. Blood is made up of multiple cells which provide different functions.  Red  blood cells carry oxygen and are used for blood loss replacement.  White blood cells fight against infection.  Platelets control bleeding.  Plasma helps clot blood.  Other blood products are available for specialized needs, such as hemophilia or other clotting disorders. BEFORE THE TRANSFUSION  Who gives blood for transfusions?   Healthy volunteers who are fully evaluated to make sure their blood is safe. This is blood bank blood. Transfusion therapy is the safest it has ever been in the practice of medicine. Before blood is taken from a donor, a complete history is taken to make sure that person has no history of diseases nor engages in risky social behavior (examples are intravenous drug use or sexual activity with multiple partners). The donor's travel history is screened to minimize risk of transmitting infections, such as malaria. The donated blood is tested for signs of infectious diseases, such as HIV and hepatitis. The blood is then tested to be sure it is compatible with you in order to minimize the chance of a transfusion reaction. If you or a relative donates blood, this is often done in anticipation of surgery and is not appropriate for emergency situations. It takes many days to process the donated blood. RISKS AND COMPLICATIONS Although transfusion therapy is very safe and saves many lives, the main dangers of transfusion include:   Getting an infectious disease.  Developing a transfusion reaction. This is an allergic reaction to something in the blood you were given. Every precaution is taken to prevent this. The decision to have a blood transfusion has been considered carefully by your caregiver before blood is given. Blood is not given unless the benefits outweigh the risks.  AFTER THE TRANSFUSION  Right after receiving a blood transfusion, you will usually feel much better and more energetic. This is especially true if your red blood cells have gotten low (anemic). The  transfusion raises the level of the red blood cells which carry oxygen, and this usually causes an energy increase.  The nurse administering the transfusion will monitor you carefully for complications. HOME CARE INSTRUCTIONS  No special instructions are needed after a transfusion. You may find your energy is better. Speak with your caregiver about any limitations on activity for underlying diseases you may have. SEEK MEDICAL CARE IF:   Your condition is not improving after your transfusion.  You develop redness or irritation at the intravenous (IV) site. SEEK IMMEDIATE MEDICAL CARE IF:  Any of the following symptoms occur over the next 12 hours:  Shaking chills.  You have a temperature by mouth above 102 F (38.9 C), not controlled by medicine.  Chest, back, or muscle pain.  People around you feel you are not acting correctly or are confused.  Shortness of breath or difficulty breathing.  Dizziness and fainting.  You get a rash or develop hives.  You have a decrease in urine output.  Your urine turns a dark color or changes to pink, red, or brown. Any of the following symptoms occur over the next 10 days:  You have a temperature by mouth above 102 F (38.9 C), not controlled by medicine.  Shortness of breath.  Weakness after normal activity.  The white part of the eye turns yellow (jaundice).  You have a decrease in the amount of urine or are urinating less often.  Your urine turns a dark color or changes to pink, red, or brown. Document Released: 08/15/2000 Document Revised: 11/10/2011 Document Reviewed: 04/03/2008 ExitCare Patient Information 2014 [place].  _______________________________________________________________________  Incentive Spirometer  An incentive spirometer is a tool that can help keep your lungs clear and active. This tool measures how well you are filling your lungs with each breath. Taking long deep breaths may help reverse or  decrease the chance of developing breathing (pulmonary) problems (especially infection) following:  A long period of time when you are unable to move or be active. BEFORE THE PROCEDURE   If the spirometer includes an indicator to show your best effort, your nurse or respiratory therapist will set it to a desired goal.  If possible, sit up straight or lean slightly forward. Try not to slouch.  Hold the incentive spirometer in an upright position. INSTRUCTIONS FOR USE  1. Sit on the edge of your bed if possible, or sit up as far as you can in bed or on a chair. 2. Hold the incentive spirometer in an upright position. 3. Breathe out normally. 4. Place the mouthpiece in your mouth and seal your lips tightly around it. 5. Breathe in slowly and as deeply as possible, raising the piston or the ball toward the top of the column. 6. Hold your breath for 3-5 seconds or for as long as possible. Allow the piston or ball to fall to the bottom of the column. 7. Remove the mouthpiece from your mouth and breathe out normally. 8. Rest for a few seconds and repeat Steps 1 through 7 at least 10 times every 1-2 hours when you are awake. Take your time and take a few normal breaths between deep breaths. 9. The spirometer may include an indicator to show your best effort. Use the indicator as a goal to work toward during  each repetition. 10. After each set of 10 deep breaths, practice coughing to be sure your lungs are clear. If you have an incision (the cut made at the time of surgery), support your incision when coughing by placing a pillow or rolled up towels firmly against it. Once you are able to get out of bed, walk around indoors and cough well. You may stop using the incentive spirometer when instructed by your caregiver.  RISKS AND COMPLICATIONS  Take your time so you do not get dizzy or light-headed.  If you are in pain, you may need to take or ask for pain medication before doing incentive spirometry.  It is harder to take a deep breath if you are having pain. AFTER USE  Rest and breathe slowly and easily.  It can be helpful to keep track of a log of your progress. Your caregiver can provide you with a simple table to help with this. If you are using the spirometer at home, follow these instructions: SEEK MEDICAL CARE IF:   You are having difficultly using the spirometer.  You have trouble using the spirometer as often as instructed.  Your pain medication is not giving enough relief while using the spirometer.  You develop fever of 100.5 F (38.1 C) or higher. SEEK IMMEDIATE MEDICAL CARE IF:   You cough up bloody sputum that had not been present before.  You develop fever of 102 F (38.9 C) or greater.  You develop worsening pain at or near the incision site. MAKE SURE YOU:   Understand these instructions.  Will watch your condition.  Will get help right away if you are not doing well or get worse. Document Released: 12/29/2006 Document Revised: 11/10/2011 Document Reviewed: 03/01/2007 ExitCare Patient Information 2014 [employer], [place].   ________________________________________________________________________    CLEAR LIQUID DIET   Foods Allowed                                                                     Foods Excluded  Coffee and tea, regular and decaf                             liquids that you cannot  Plain Jell-O in any flavor                                             see through such as: Fruit ices (not with fruit pulp)                                     milk, soups, orange juice  Iced Popsicles                                    All solid food Carbonated beverages, regular and diet  Cranberry, grape and apple juices Sports drinks like Gatorade Lightly seasoned clear broth or consume(fat free) Sugar, honey syrup  Sample Menu Breakfast                                Lunch                                      Supper Cranberry juice                    Beef broth                            Chicken broth Jell-O                                     Grape juice                           Apple juice Coffee or tea                        Jell-O                                      Popsicle                                                Coffee or tea                        Coffee or tea  _____________________________________________________________________

## 2017-03-27 ENCOUNTER — Encounter (HOSPITAL_COMMUNITY): Payer: Self-pay

## 2017-03-27 ENCOUNTER — Encounter (HOSPITAL_COMMUNITY)
Admission: RE | Admit: 2017-03-27 | Discharge: 2017-03-27 | Disposition: A | Discharge status: Home / Self Care | Payer: BLUE CROSS/BLUE SHIELD | Source: Ambulatory Visit | Attending: Orthopedic Surgery | Admitting: Orthopedic Surgery

## 2017-03-27 DIAGNOSIS — Z01812 Encounter for preprocedural laboratory examination: Secondary | ICD-10-CM | POA: Insufficient documentation

## 2017-03-27 HISTORY — DX: Nausea with vomiting, unspecified: R11.2

## 2017-03-27 HISTORY — DX: Other specified postprocedural states: Z98.890

## 2017-03-27 HISTORY — DX: Peripheral vascular disease, unspecified: I73.9

## 2017-03-27 LAB — CBC
HEMATOCRIT: 29.8 % — AB (ref 36.0–46.0)
Hemoglobin: 9.9 g/dL — ABNORMAL LOW (ref 12.0–15.0)
MCH: 26.8 pg (ref 26.0–34.0)
MCHC: 33.2 g/dL (ref 30.0–36.0)
MCV: 80.5 fL (ref 78.0–100.0)
Platelets: 275 10*3/uL (ref 150–400)
RBC: 3.7 MIL/uL — ABNORMAL LOW (ref 3.87–5.11)
RDW: 15.2 % (ref 11.5–15.5)
WBC: 5 10*3/uL (ref 4.0–10.5)

## 2017-03-27 LAB — SURGICAL PCR SCREEN
MRSA, PCR: NEGATIVE
STAPHYLOCOCCUS AUREUS: POSITIVE — AB

## 2017-03-27 NOTE — Progress Notes (Signed)
Consent form at procedure part does not indicate which knee please correct.  Thank You.

## 2017-03-27 NOTE — Progress Notes (Signed)
CBC done 03/27/17-faxed via epic to Dr Charlann Boxerolin.

## 2017-03-30 NOTE — H&P (Signed)
Kathy Jennings is an 66 y.o. female.    Chief Complaint:   Infected right TKA  Procedure: I&D of right knee wityh possible poly exchange and possible resection of TKA and placement of abx spacer  HPI: Pt is a 66 y.o. female complaining of right knee pain and drainage since February 08, 2017.  Her right TKA was performed on 10/31/2014 by Dr. Charlann Boxerlin.  Pain and drainage have gradually increased.  X-rays in the clinic show previous TKA of the right knee. Pt has tried various conservative treatments which have failed to alleviate their symptoms. Various  options are discussed with the patient. Risks, benefits and expectations were discussed with the patient. Patient understand the risks, benefits and expectations and wishes to proceed with surgery.    PCP: Estanislado PandySasser, Paul W, MD  D/C Plans:       Home   Post-op Meds:       No Rx given  Tranexamic Acid:      To be given - IV   Decadron:      Is to be given  FYI:     ASA  Norco  Avoid Iron  No Celebrex  DME:   Pt already has equipment  PT:   To be determined   PMH: Past Medical History:  Diagnosis Date  . Arthritis   . Complication of anesthesia    slow to wake up  . GERD (gastroesophageal reflux disease)   . OAB (overactive bladder)   . Peripheral vascular disease (HCC)   . PONV (postoperative nausea and vomiting)   . Urgency of urination   . Varicose veins     PSH: Past Surgical History:  Procedure Laterality Date  . ABDOMINAL HYSTERECTOMY  30 YRS AGO  . COLONOSCOPY  2005  . TOTAL HIP ARTHROPLASTY Right 04/18/2014   Procedure: RIGHT TOTAL HIP ARTHROPLASTY ANTERIOR APPROACH;  Surgeon: Shelda PalMatthew D Olin, MD;  Location: [facility];  Service: Orthopedics;  Laterality: Right;  . TOTAL KNEE ARTHROPLASTY Right 10/31/2014   Procedure: TOTAL RIGHT KNEE ARTHROPLASTY;  Surgeon: Shelda PalMatthew D Olin, MD;  Location: [facility];  Service: Orthopedics;  Laterality: Right;    Social History:  reports that she has never smoked. She has never used smokeless  tobacco. She reports that she does not drink alcohol or use drugs.  Allergies:  Allergies  Allergen Reactions  . Sulfa Antibiotics Nausea And Vomiting    Medications: No current facility-administered medications for this encounter.    Current Outpatient Prescriptions  Medication Sig Dispense Refill  . acetaminophen (TYLENOL) 500 MG tablet Take 1,000 mg by mouth at bedtime as needed (for pain/rest.).     Marland Kitchen. aspirin EC 81 MG tablet Take 81 mg by mouth daily with lunch.    . Cholecalciferol (VITAMIN D3) 2000 units capsule Take 2,000 Units by mouth daily with lunch.    . loratadine (CLARITIN) 10 MG tablet Take 10 mg by mouth daily with lunch.    . Multiple Vitamin (MULTIVITAMIN WITH MINERALS) TABS tablet Take 1 tablet by mouth daily with lunch.    . Omega-3 Fatty Acids (FISH OIL) 1200 MG CAPS Take 2,400 mg by mouth daily with lunch.    Marland Kitchen. omeprazole (PRILOSEC) 20 MG capsule Take 20 mg by mouth daily with lunch.     . sodium chloride (OCEAN) 0.65 % SOLN nasal spray Place 1 spray into both nostrils as needed for congestion.    . cephALEXin (KEFLEX) 500 MG capsule Take 500 mg by mouth 4 (four) times  daily.         Review of Systems  Constitutional: Negative.   HENT: Negative.   Eyes: Negative.   Respiratory: Negative.   Cardiovascular: Negative.   Gastrointestinal: Positive for heartburn.  Genitourinary: Negative.   Musculoskeletal: Positive for joint pain.  Skin: Negative.   Neurological: Negative.   Endo/Heme/Allergies: Negative.   Psychiatric/Behavioral: Negative.        Physical Exam  Constitutional: She is oriented to person, place, and time. She appears well-developed.  HENT:  Head: Normocephalic.  Eyes: Pupils are equal, round, and reactive to light.  Neck: Neck supple. No JVD present. No tracheal deviation present. No thyromegaly present.  Cardiovascular: Normal rate, regular rhythm and intact distal pulses.   Respiratory: Effort normal and breath sounds normal. No  respiratory distress. She has no wheezes.  GI: Soft. There is no tenderness. There is no guarding.  Musculoskeletal:       Left knee: She exhibits decreased range of motion, swelling, effusion, laceration (with drainage) and erythema. She exhibits no deformity. Tenderness found.  Lymphadenopathy:    She has no cervical adenopathy.  Neurological: She is alert and oriented to person, place, and time.  Skin: Skin is warm and dry.  Psychiatric: She has a normal mood and affect.       Assessment/Plan Assessment:   Infected right TKA     Plan: Patient will undergo a I&D of right knee wityh possible poly exchange and possible resection of TKA and placement of abx spacer on 03/31/2017 [facility]. Risks benefits and expectations were discussed with the patient. Patient understand risks, benefits and expectations and wishes to proceed.     Anastasio AuerbachMatthew S. Haidy Kackley   PA-C  03/30/2017, 1:52 PM

## 2017-03-30 NOTE — Progress Notes (Signed)
Pt notified of scheduled OR time change. Agrees to be here 1015am. Denies questions.

## 2017-03-31 ENCOUNTER — Inpatient Hospital Stay (HOSPITAL_COMMUNITY)
Admission: RE | Admit: 2017-03-31 | Discharge: 2017-04-03 | DRG: 487 | Disposition: A | Discharge status: Home Health | Payer: Self-pay | Source: Ambulatory Visit | Attending: Orthopedic Surgery | Admitting: Orthopedic Surgery

## 2017-03-31 ENCOUNTER — Inpatient Hospital Stay (HOSPITAL_COMMUNITY): Payer: Self-pay | Admitting: Certified Registered"

## 2017-03-31 ENCOUNTER — Encounter (HOSPITAL_COMMUNITY): Payer: Self-pay

## 2017-03-31 ENCOUNTER — Encounter (HOSPITAL_COMMUNITY)
Admission: RE | Disposition: A | Discharge status: Home Health | Payer: Self-pay | Source: Ambulatory Visit | Attending: Orthopedic Surgery

## 2017-03-31 DIAGNOSIS — T8459XA Infection and inflammatory reaction due to other internal joint prosthesis, initial encounter: Secondary | ICD-10-CM

## 2017-03-31 DIAGNOSIS — I739 Peripheral vascular disease, unspecified: Secondary | ICD-10-CM | POA: Diagnosis present

## 2017-03-31 DIAGNOSIS — T8453XA Infection and inflammatory reaction due to internal right knee prosthesis, initial encounter: Principal | ICD-10-CM | POA: Diagnosis present

## 2017-03-31 DIAGNOSIS — I8390 Asymptomatic varicose veins of unspecified lower extremity: Secondary | ICD-10-CM | POA: Diagnosis present

## 2017-03-31 DIAGNOSIS — Z5181 Encounter for therapeutic drug level monitoring: Secondary | ICD-10-CM

## 2017-03-31 DIAGNOSIS — Z7982 Long term (current) use of aspirin: Secondary | ICD-10-CM

## 2017-03-31 DIAGNOSIS — Z882 Allergy status to sulfonamides status: Secondary | ICD-10-CM

## 2017-03-31 DIAGNOSIS — N3281 Overactive bladder: Secondary | ICD-10-CM | POA: Diagnosis present

## 2017-03-31 DIAGNOSIS — Z96659 Presence of unspecified artificial knee joint: Secondary | ICD-10-CM

## 2017-03-31 DIAGNOSIS — Y831 Surgical operation with implant of artificial internal device as the cause of abnormal reaction of the patient, or of later complication, without mention of misadventure at the time of the procedure: Secondary | ICD-10-CM | POA: Diagnosis present

## 2017-03-31 DIAGNOSIS — Z96641 Presence of right artificial hip joint: Secondary | ICD-10-CM | POA: Diagnosis present

## 2017-03-31 DIAGNOSIS — Z90710 Acquired absence of both cervix and uterus: Secondary | ICD-10-CM

## 2017-03-31 DIAGNOSIS — K219 Gastro-esophageal reflux disease without esophagitis: Secondary | ICD-10-CM | POA: Diagnosis present

## 2017-03-31 HISTORY — PX: I & D KNEE WITH POLY EXCHANGE: SHX5024

## 2017-03-31 LAB — TYPE AND SCREEN
ABO/RH(D): A POS
ANTIBODY SCREEN: NEGATIVE

## 2017-03-31 LAB — BASIC METABOLIC PANEL
ANION GAP: 7 (ref 5–15)
BUN: 12 mg/dL (ref 6–20)
CALCIUM: 8.7 mg/dL — AB (ref 8.9–10.3)
CHLORIDE: 101 mmol/L (ref 101–111)
CO2: 26 mmol/L (ref 22–32)
Creatinine, Ser: 0.55 mg/dL (ref 0.44–1.00)
GFR calc non Af Amer: >60 mL/min (ref >60)
GLUCOSE: 229 mg/dL — AB (ref 65–99)
POTASSIUM: 4 mmol/L (ref 3.5–5.1)
Sodium: 134 mmol/L — ABNORMAL LOW (ref 135–145)

## 2017-03-31 SURGERY — IRRIGATION AND DEBRIDEMENT KNEE WITH POLY EXCHANGE
Anesthesia: General | Site: Knee | Laterality: Right

## 2017-03-31 MED ORDER — SODIUM CHLORIDE 0.9 % IV SOLN
INTRAVENOUS | Status: DC
  Administered 2017-03-31: 100 mL/h via INTRAVENOUS

## 2017-03-31 MED ORDER — HYDROMORPHONE HCL 2 MG/ML IJ SOLN
INTRAMUSCULAR | Status: AC
  Filled 2017-03-31: qty 1

## 2017-03-31 MED ORDER — VANCOMYCIN HCL IN DEXTROSE 1-5 GM/200ML-% IV SOLN
1000.0000 mg | Freq: Once | INTRAVENOUS | Status: AC
  Administered 2017-03-31: 1000 mg via INTRAVENOUS

## 2017-03-31 MED ORDER — HYDROMORPHONE HCL-NACL 0.5-0.9 MG/ML-% IV SOSY
0.2500 mg | PREFILLED_SYRINGE | INTRAVENOUS | Status: DC | PRN
  Administered 2017-03-31 (×4): 0.5 mg via INTRAVENOUS

## 2017-03-31 MED ORDER — CHLORHEXIDINE GLUCONATE 4 % EX LIQD: 60.0000 mL | Freq: Once | CUTANEOUS | Status: DC

## 2017-03-31 MED ORDER — DEXAMETHASONE SODIUM PHOSPHATE 10 MG/ML IJ SOLN
INTRAMUSCULAR | Status: AC
  Filled 2017-03-31: qty 1

## 2017-03-31 MED ORDER — DEXAMETHASONE SODIUM PHOSPHATE 10 MG/ML IJ SOLN
10.0000 mg | Freq: Once | INTRAMUSCULAR | Status: AC
  Administered 2017-04-01: 10 mg via INTRAVENOUS
  Filled 2017-03-31: qty 1

## 2017-03-31 MED ORDER — LIDOCAINE 2% (20 MG/ML) 5 ML SYRINGE
INTRAMUSCULAR | Status: AC
  Filled 2017-03-31: qty 5

## 2017-03-31 MED ORDER — RIFAMPIN 300 MG PO CAPS
300.0000 mg | ORAL_CAPSULE | Freq: Two times a day (BID) | ORAL | Status: DC
  Administered 2017-03-31 – 2017-04-03 (×6): 300 mg via ORAL
  Filled 2017-03-31 (×6): qty 1

## 2017-03-31 MED ORDER — BUPIVACAINE-EPINEPHRINE 0.25% -1:200000 IJ SOLN
INTRAMUSCULAR | Status: AC
  Filled 2017-03-31: qty 1

## 2017-03-31 MED ORDER — LIDOCAINE 2% (20 MG/ML) 5 ML SYRINGE
INTRAMUSCULAR | Status: DC | PRN
  Administered 2017-03-31: 100 mg via INTRAVENOUS

## 2017-03-31 MED ORDER — VANCOMYCIN HCL 1000 MG IV SOLR
INTRAVENOUS | Status: DC | PRN
  Administered 2017-03-31: 1000 mg via TOPICAL

## 2017-03-31 MED ORDER — HYDROMORPHONE HCL-NACL 0.5-0.9 MG/ML-% IV SOSY
PREFILLED_SYRINGE | INTRAVENOUS | Status: AC
  Filled 2017-03-31: qty 1

## 2017-03-31 MED ORDER — POLYETHYLENE GLYCOL 3350 17 G PO PACK
17.0000 g | PACK | Freq: Two times a day (BID) | ORAL | Status: DC
  Administered 2017-03-31 – 2017-04-03 (×3): 17 g via ORAL
  Filled 2017-03-31 (×2): qty 1

## 2017-03-31 MED ORDER — VANCOMYCIN HCL 1000 MG IV SOLR
INTRAVENOUS | Status: AC
  Filled 2017-03-31: qty 1000

## 2017-03-31 MED ORDER — BISACODYL 10 MG RE SUPP: 10.0000 mg | Freq: Every day | RECTAL | Status: DC | PRN

## 2017-03-31 MED ORDER — NON FORMULARY: 20.0000 mg | Freq: Every day | Status: DC

## 2017-03-31 MED ORDER — SODIUM CHLORIDE 0.9 % IJ SOLN
INTRAMUSCULAR | Status: AC
  Filled 2017-03-31: qty 50

## 2017-03-31 MED ORDER — MIDAZOLAM HCL 2 MG/2ML IJ SOLN
INTRAMUSCULAR | Status: AC
  Filled 2017-03-31: qty 2

## 2017-03-31 MED ORDER — FENTANYL CITRATE (PF) 100 MCG/2ML IJ SOLN
INTRAMUSCULAR | Status: DC | PRN
  Administered 2017-03-31: 25 ug via INTRAVENOUS
  Administered 2017-03-31: 50 ug via INTRAVENOUS
  Administered 2017-03-31: 25 ug via INTRAVENOUS
  Administered 2017-03-31 (×2): 50 ug via INTRAVENOUS

## 2017-03-31 MED ORDER — LACTATED RINGERS IV SOLN
INTRAVENOUS | Status: DC | PRN
  Administered 2017-03-31: 13:00:00 via INTRAVENOUS

## 2017-03-31 MED ORDER — FENTANYL CITRATE (PF) 100 MCG/2ML IJ SOLN
INTRAMUSCULAR | Status: AC
  Filled 2017-03-31: qty 2

## 2017-03-31 MED ORDER — VANCOMYCIN HCL IN DEXTROSE 1-5 GM/200ML-% IV SOLN
INTRAVENOUS | Status: AC
  Filled 2017-03-31: qty 200

## 2017-03-31 MED ORDER — LACTATED RINGERS IV SOLN
INTRAVENOUS | Status: DC | PRN
  Administered 2017-03-31: 12:00:00 via INTRAVENOUS

## 2017-03-31 MED ORDER — PHENOL 1.4 % MT LIQD: 1.0000 | OROMUCOSAL | Status: DC | PRN

## 2017-03-31 MED ORDER — PROPOFOL 10 MG/ML IV BOLUS
INTRAVENOUS | Status: AC
  Filled 2017-03-31: qty 40

## 2017-03-31 MED ORDER — SODIUM CHLORIDE 0.9 % IV SOLN
1000.0000 mg | Freq: Once | INTRAVENOUS | Status: AC
  Administered 2017-03-31: 1000 mg via INTRAVENOUS
  Filled 2017-03-31: qty 1100

## 2017-03-31 MED ORDER — ONDANSETRON HCL 4 MG/2ML IJ SOLN
INTRAMUSCULAR | Status: AC
  Filled 2017-03-31: qty 2

## 2017-03-31 MED ORDER — ONDANSETRON HCL 4 MG/2ML IJ SOLN: 4.0000 mg | Freq: Four times a day (QID) | INTRAMUSCULAR | Status: DC | PRN

## 2017-03-31 MED ORDER — DEXAMETHASONE SODIUM PHOSPHATE 10 MG/ML IJ SOLN
10.0000 mg | Freq: Once | INTRAMUSCULAR | Status: AC
  Administered 2017-03-31: 10 mg via INTRAVENOUS

## 2017-03-31 MED ORDER — LACTATED RINGERS IV SOLN: INTRAVENOUS | Status: DC

## 2017-03-31 MED ORDER — LORATADINE 10 MG PO TABS
10.0000 mg | ORAL_TABLET | Freq: Every day | ORAL | Status: DC
  Administered 2017-04-01 – 2017-04-03 (×3): 10 mg via ORAL
  Filled 2017-03-31 (×3): qty 1

## 2017-03-31 MED ORDER — TRANEXAMIC ACID 1000 MG/10ML IV SOLN
1000.0000 mg | INTRAVENOUS | Status: AC
  Administered 2017-03-31: 1000 mg via INTRAVENOUS
  Filled 2017-03-31: qty 10

## 2017-03-31 MED ORDER — ONDANSETRON HCL 4 MG/2ML IJ SOLN
INTRAMUSCULAR | Status: DC | PRN
  Administered 2017-03-31: 4 mg via INTRAVENOUS

## 2017-03-31 MED ORDER — KETOROLAC TROMETHAMINE 30 MG/ML IJ SOLN
INTRAMUSCULAR | Status: AC
  Filled 2017-03-31: qty 1

## 2017-03-31 MED ORDER — VANCOMYCIN HCL IN DEXTROSE 1-5 GM/200ML-% IV SOLN
1000.0000 mg | Freq: Two times a day (BID) | INTRAVENOUS | Status: DC
  Administered 2017-04-01 – 2017-04-02 (×4): 1000 mg via INTRAVENOUS
  Filled 2017-03-31 (×5): qty 200

## 2017-03-31 MED ORDER — HYDROCODONE-ACETAMINOPHEN 7.5-325 MG PO TABS
1.0000 | ORAL_TABLET | ORAL | Status: DC
  Administered 2017-03-31: 2 via ORAL
  Administered 2017-03-31 (×2): 1 via ORAL
  Administered 2017-04-01 (×2): 2 via ORAL
  Administered 2017-04-01: 1 via ORAL
  Administered 2017-04-01 – 2017-04-03 (×12): 2 via ORAL
  Filled 2017-03-31 (×6): qty 2
  Filled 2017-03-31: qty 1
  Filled 2017-03-31 (×3): qty 2
  Filled 2017-03-31: qty 1
  Filled 2017-03-31: qty 2
  Filled 2017-03-31: qty 1
  Filled 2017-03-31 (×5): qty 2

## 2017-03-31 MED ORDER — MENTHOL 3 MG MT LOZG: 1.0000 | LOZENGE | OROMUCOSAL | Status: DC | PRN

## 2017-03-31 MED ORDER — CEFAZOLIN SODIUM-DEXTROSE 2-4 GM/100ML-% IV SOLN
2.0000 g | INTRAVENOUS | Status: AC
  Administered 2017-03-31: 2 g via INTRAVENOUS

## 2017-03-31 MED ORDER — OMEPRAZOLE 20 MG PO CPDR
20.0000 mg | DELAYED_RELEASE_CAPSULE | ORAL | Status: DC
  Administered 2017-04-01 – 2017-04-03 (×3): 20 mg via ORAL
  Filled 2017-03-31 (×3): qty 1

## 2017-03-31 MED ORDER — ALUM & MAG HYDROXIDE-SIMETH 200-200-20 MG/5ML PO SUSP: 15.0000 mL | ORAL | Status: DC | PRN

## 2017-03-31 MED ORDER — ASPIRIN 81 MG PO CHEW
81.0000 mg | CHEWABLE_TABLET | Freq: Two times a day (BID) | ORAL | Status: DC
  Administered 2017-03-31 – 2017-04-03 (×6): 81 mg via ORAL
  Filled 2017-03-31 (×6): qty 1

## 2017-03-31 MED ORDER — PROMETHAZINE HCL 25 MG/ML IJ SOLN: 6.2500 mg | INTRAMUSCULAR | Status: DC | PRN

## 2017-03-31 MED ORDER — METHOCARBAMOL 1000 MG/10ML IJ SOLN
500.0000 mg | Freq: Four times a day (QID) | INTRAMUSCULAR | Status: DC | PRN
  Administered 2017-03-31: 500 mg via INTRAVENOUS
  Filled 2017-03-31: qty 550

## 2017-03-31 MED ORDER — FUROSEMIDE 10 MG/ML IJ SOLN
INTRAMUSCULAR | Status: DC | PRN
  Administered 2017-03-31: 10 mg via INTRAMUSCULAR

## 2017-03-31 MED ORDER — DOCUSATE SODIUM 100 MG PO CAPS
100.0000 mg | ORAL_CAPSULE | Freq: Two times a day (BID) | ORAL | Status: DC
  Administered 2017-03-31 – 2017-04-03 (×6): 100 mg via ORAL
  Filled 2017-03-31 (×6): qty 1

## 2017-03-31 MED ORDER — MIDAZOLAM HCL 5 MG/5ML IJ SOLN
INTRAMUSCULAR | Status: DC | PRN
  Administered 2017-03-31: 2 mg via INTRAVENOUS

## 2017-03-31 MED ORDER — PROPOFOL 10 MG/ML IV BOLUS
INTRAVENOUS | Status: DC | PRN
  Administered 2017-03-31: 160 mg via INTRAVENOUS

## 2017-03-31 MED ORDER — EPHEDRINE SULFATE-NACL 50-0.9 MG/10ML-% IV SOSY
PREFILLED_SYRINGE | INTRAVENOUS | Status: DC | PRN
  Administered 2017-03-31: 5 mg via INTRAVENOUS

## 2017-03-31 MED ORDER — FENTANYL CITRATE (PF) 100 MCG/2ML IJ SOLN: 25.0000 ug | INTRAMUSCULAR | Status: DC | PRN

## 2017-03-31 MED ORDER — HYDROMORPHONE HCL 1 MG/ML IJ SOLN
INTRAMUSCULAR | Status: DC | PRN
  Administered 2017-03-31: .4 mg via INTRAVENOUS
  Administered 2017-03-31: .2 mg via INTRAVENOUS
  Administered 2017-03-31: .4 mg via INTRAVENOUS

## 2017-03-31 MED ORDER — METOCLOPRAMIDE HCL 5 MG/ML IJ SOLN: 5.0000 mg | Freq: Three times a day (TID) | INTRAMUSCULAR | Status: DC | PRN

## 2017-03-31 MED ORDER — DIPHENHYDRAMINE HCL 25 MG PO CAPS
25.0000 mg | ORAL_CAPSULE | Freq: Four times a day (QID) | ORAL | Status: DC | PRN
  Administered 2017-04-01: 25 mg via ORAL
  Filled 2017-03-31: qty 1

## 2017-03-31 MED ORDER — FUROSEMIDE 10 MG/ML IJ SOLN
INTRAMUSCULAR | Status: AC
  Filled 2017-03-31: qty 2

## 2017-03-31 MED ORDER — MAGNESIUM CITRATE PO SOLN: 1.0000 | Freq: Once | ORAL | Status: DC | PRN

## 2017-03-31 MED ORDER — ONDANSETRON HCL 4 MG PO TABS: 4.0000 mg | ORAL_TABLET | Freq: Four times a day (QID) | ORAL | Status: DC | PRN

## 2017-03-31 MED ORDER — 0.9 % SODIUM CHLORIDE (POUR BTL) OPTIME
TOPICAL | Status: DC | PRN
  Administered 2017-03-31: 1000 mL

## 2017-03-31 MED ORDER — SODIUM CHLORIDE 0.9 % IR SOLN
Status: DC | PRN
  Administered 2017-03-31: 7000 mL

## 2017-03-31 MED ORDER — METHOCARBAMOL 500 MG PO TABS
500.0000 mg | ORAL_TABLET | Freq: Four times a day (QID) | ORAL | Status: DC | PRN
  Administered 2017-03-31 – 2017-04-03 (×6): 500 mg via ORAL
  Filled 2017-03-31 (×6): qty 1

## 2017-03-31 MED ORDER — CEFAZOLIN SODIUM-DEXTROSE 2-4 GM/100ML-% IV SOLN
INTRAVENOUS | Status: AC
  Filled 2017-03-31: qty 100

## 2017-03-31 MED ORDER — EPHEDRINE 5 MG/ML INJ
INTRAVENOUS | Status: AC
  Filled 2017-03-31: qty 10

## 2017-03-31 MED ORDER — METOCLOPRAMIDE HCL 5 MG PO TABS: 5.0000 mg | ORAL_TABLET | Freq: Three times a day (TID) | ORAL | Status: DC | PRN

## 2017-03-31 MED ORDER — HYDROMORPHONE HCL-NACL 0.5-0.9 MG/ML-% IV SOSY
PREFILLED_SYRINGE | INTRAVENOUS | Status: AC
  Filled 2017-03-31: qty 2

## 2017-03-31 SURGICAL SUPPLY — 48 items
ATTUNE PSRP INSR SZ3 6 KNEE (Insert) ×2 IMPLANT
BAG ZIPLOCK 12X15 (MISCELLANEOUS) ×2 IMPLANT
BANDAGE ACE 6X5 VEL STRL LF (GAUZE/BANDAGES/DRESSINGS) ×2 IMPLANT
BLADE SAW SGTL 11.0X1.19X90.0M (BLADE) IMPLANT
BRUSH FEMORAL CANAL (MISCELLANEOUS) ×2 IMPLANT
CLOTH BEACON ORANGE TIMEOUT ST (SAFETY) ×2 IMPLANT
COVER SURGICAL LIGHT HANDLE (MISCELLANEOUS) ×2 IMPLANT
CUFF TOURN SGL QUICK 34 (TOURNIQUET CUFF) ×1
CUFF TRNQT CYL 34X4X40X1 (TOURNIQUET CUFF) ×1 IMPLANT
DECANTER SPIKE VIAL GLASS SM (MISCELLANEOUS) ×2 IMPLANT
DRAPE U-SHAPE 47X51 STRL (DRAPES) ×2 IMPLANT
DRSG AQUACEL AG ADV 3.5X10 (GAUZE/BANDAGES/DRESSINGS) IMPLANT
DRSG AQUACEL AG ADV 3.5X14 (GAUZE/BANDAGES/DRESSINGS) IMPLANT
DURAPREP 26ML APPLICATOR (WOUND CARE) ×4 IMPLANT
ELECT REM PT RETURN 15FT ADLT (MISCELLANEOUS) ×2 IMPLANT
GAUZE SPONGE 4X4 12PLY STRL (GAUZE/BANDAGES/DRESSINGS) ×2 IMPLANT
GAUZE XEROFORM 1X8 LF (GAUZE/BANDAGES/DRESSINGS) ×4 IMPLANT
GLOVE BIO SURGEON STRL SZ 6.5 (GLOVE) ×2 IMPLANT
GLOVE BIOGEL PI IND STRL 6.5 (GLOVE) ×1 IMPLANT
GLOVE BIOGEL PI IND STRL 7.5 (GLOVE) ×4 IMPLANT
GLOVE BIOGEL PI IND STRL 8.5 (GLOVE) ×1 IMPLANT
GLOVE BIOGEL PI INDICATOR 6.5 (GLOVE) ×1
GLOVE BIOGEL PI INDICATOR 7.5 (GLOVE) ×4
GLOVE BIOGEL PI INDICATOR 8.5 (GLOVE) ×1
GLOVE ECLIPSE 8.0 STRL XLNG CF (GLOVE) ×4 IMPLANT
GLOVE ORTHO TXT STRL SZ7.5 (GLOVE) ×2 IMPLANT
GLOVE SURG SS PI 7.5 STRL IVOR (GLOVE) ×2 IMPLANT
GOWN SPEC L3 XXLG W/TWL (GOWN DISPOSABLE) ×2 IMPLANT
GOWN STRL REUS W/TWL LRG LVL3 (GOWN DISPOSABLE) ×4 IMPLANT
GOWN STRL REUS W/TWL XL LVL3 (GOWN DISPOSABLE) ×2 IMPLANT
HANDPIECE INTERPULSE COAX TIP (DISPOSABLE) ×1
MANIFOLD NEPTUNE II (INSTRUMENTS) ×2 IMPLANT
PACK TOTAL KNEE CUSTOM (KITS) ×2 IMPLANT
PAD ABD 8X10 STRL (GAUZE/BANDAGES/DRESSINGS) ×2 IMPLANT
PADDING CAST COTTON 6X4 STRL (CAST SUPPLIES) ×2 IMPLANT
POSITIONER SURGICAL ARM (MISCELLANEOUS) ×2 IMPLANT
SET HNDPC FAN SPRY TIP SCT (DISPOSABLE) ×1 IMPLANT
SPONGE LAP 18X18 X RAY DECT (DISPOSABLE) IMPLANT
STAPLER VISISTAT 35W (STAPLE) ×2 IMPLANT
SUT PDS AB 1 CT1 27 (SUTURE) ×6 IMPLANT
SUT VIC AB 1 CT1 36 (SUTURE) ×2 IMPLANT
SUT VIC AB 2-0 CT1 27 (SUTURE) ×4
SUT VIC AB 2-0 CT1 TAPERPNT 27 (SUTURE) ×4 IMPLANT
SUT VLOC 180 0 24IN GS25 (SUTURE) ×2 IMPLANT
SWAB COLLECTION DEVICE MRSA (MISCELLANEOUS) ×2 IMPLANT
SWAB CULTURE ESWAB REG 1ML (MISCELLANEOUS) ×2 IMPLANT
SYR 50ML LL SCALE MARK (SYRINGE) ×2 IMPLANT
WRAP KNEE MAXI GEL POST OP (GAUZE/BANDAGES/DRESSINGS) ×2 IMPLANT

## 2017-03-31 NOTE — Op Note (Signed)
NAMMarland Kitchen:  Viann ShoveKINGSTON, Illianna              ACCOUNT NO.:  [id number]  MEDICAL RECORD NO.:  [id number]  LOCATION:                                 FACILITY:  PHYSICIAN:  Madlyn FrankelMatthew D. Charlann Boxerlin, M.D.       DATE OF BIRTH:  DATE OF PROCEDURE:  03/31/2017 DATE OF DISCHARGE:                              OPERATIVE REPORT   PREOPERATIVE DIAGNOSIS:  Infected right total knee arthroplasty.  POSTOPERATIVE DIAGNOSIS:  Infected right total knee arthroplasty.  PROCEDURE:  Excisional and nonexcisional debridement of right knee with polyethylene exchange.  COMPONENT UTILIZED:  Attune size 3 6-mm posterior-stabilized AOX insert.  SURGEON:  Madlyn FrankelMatthew D. Charlann Boxerlin, M.D.  ASSISTANT:  Lanney GinsMatthew Babish, PA-C.  Note that Mr. Carmon SailsBabish was present for the entirety of the case from preoperative positioning, perioperative management of the operative extremity, general facilitation of case and primary wound closure.  ANESTHESIA:  General.  SPECIMENS:  Fluid from the joint was taken as culture swabs for Gram stain culture evaluation, aerobic and anaerobic.  DRAINS:  None.  COMPLICATIONS:  None apparent.  FINDINGS:  See the body of the dictated operative note.  TOURNIQUET TIME:  60 minutes at 250 mmHg.  INDICATIONS FOR PROCEDURE:  Ms. Ok AnisKingston is a pleasant [age] year old patient of mine with history of total knee arthroplasty about 2 years ago.  She presented to the office about 3 weeks ago with increasing pain in her right knee.  She did not report significant fevers.  At the time of the initial evaluation, there was no evidence of any drainage, slight erythematous changes around her lower aspect of the knee.  Though the suspicion for infection was there due to the current medical insurance issues from a job loss standpoint.  She was hoping that this would resolve with the use of anti-inflammatory medications and ice.  Unfortunately, she returned to the office about a week later with an increasing erythematous change and  a boil over the distal aspect of her incision.  Given these findings, she was initially aspirated with a small amount of blood removed.  [facility].  This came back negative, but had 3500 white cells.  Given the appearance of her knee, it was obvious that she needed to have her knee operated on.  The ultimate suspicion was the deep knee infection.  Risks, benefits and necessity of the procedure were discussed.  Unfortunately, given the fact that they are currently without insurance, this will be a challenging endeavor for them.  I am going to treat this as an acute infection to try to alleviate the need for any future surgeries.  For that reason, we will perform an I and D with poly exchange, IV antibiotics for 6-8 weeks with transition to oral antibiotics.  If these were to fail ultimately, then hopefully by that time, they were unable to acquire insurance to cover the need for 2-stage procedure.  Consent was obtained for benefit of this problem.  PROCEDURE IN DETAIL:  The patient was brought to the operative theater. Once adequate anesthesia, preoperative antibiotics held until culture swabs were taken, we did give her combination of Ancef and vancomycin.  Her old incision was  identified and lifting out the area where the skin had broken down anterior-distal.  Soft tissue planes created.  At this point, the inferior aspect of the patellar tendon was noted to have arose very medially.  I did create a median arthrotomy.  Once I got into the joint, we took culture swabs and gave antibiotics.  At this point, further exposure was carried out.  With the knee arthrotomy made, we identified this area distally along the tendon, which was excised.  There was a tract that appears to go along the hamstring tendons medially, which was palpable as far as I could reach my finger in.  There was another defect in the retinacular area proximally with  some purulence evidence.  This was proximal to the medial epicondyle.  At this point, the significant portion of the case was performed with synovectomy and scar debridement.  The entire medial gutter was recreated, the lateral gutter recreated, the suprapatellar pouch was debrided and recreated sharply using the Bovie.  At this point, so now, I have excised the skin, subcutaneous tissue, nonviable tendon as well as intra-articular scar.  This was all done sharply with a knife and/or the Bovie cautery.  Once the debridement was radically debrided adequate enough, I did remove the old polyethylene insert allowing for further debridement of the posterior aspect of the knee.  At this point, we irrigated the knee with 6 L of normal saline solution covering all surfaces as possible including using the canal brush irrigator in the area for retinacular penetration at the pes anserine region as well as proximal.  Once we irrigated with 6 L, we all changed gloves, opened up the new polyethylene insert and inserted this into the knee.  I did, at this point, apply vancomycin powder 1 g into the deep aspect of the wound, particularly in the areas of retinacular breaching proximal and distal.  The extensor mechanism was then reapproximated using 1 PDS suture in an interrupted fashion.  At this point, we irrigated 1 more liter of normal saline solution with pulse lavage over the soft tissue extensor mechanism.  The remainder of the wound now was closed with 2-0 Vicryl and staples on the skin.  The skin was cleaned, dried and dressed sterilely using Xeroform and bulky dressing.  She was then brought to the recovery room in stable condition.  Findings will be reviewed with her husband.  We will consult Infectious Disease, we will get a PICC line.  We will have Ms. Ok AnisKingston work with her social workers to find available care due to the financial situation.     Madlyn FrankelMatthew D. Charlann Boxerlin,  M.D.     MDO/MEDQ  D:  03/31/2017  T:  03/31/2017  Job:  [id number]

## 2017-03-31 NOTE — Progress Notes (Signed)
Pharmacy Antibiotic Note  Kathy Jennings is a 66 y.o. female with right TKA on 11/15/14 and has been experiencing pain and drainage from surgical site since 02/08/17. Patient on keflex PTA.  Now s/p I&D of right knee with poly exchange on 03/31/17.  To start vancomycin for knee infection.  Patient received vancomycin 1000 mg intra-op at 1332 on 7/31; rifampin ordered per MD  Plan:  Vancomycin 1000 mg IV q12; goal trough 15-20 mcg/mL  Measure vancomycin troughs as clinically appropriate  F/u Cx, clinical course, renal function  _____________________________   Height: 5\' 4"  (162.6 cm) Weight: 195 lb (88.5 kg) IBW/kg (Calculated) : 54.7  Temp (24hrs), Avg:98.2 F (36.8 C), Min:97.9 F (36.6 C), Max:98.6 F (37 C)   Recent Labs Lab 03/27/17 0837 03/31/17 2151  WBC 5.0  --   CREATININE  --  0.55    Estimated Creatinine Clearance: 77.5 mL/min (by C-G formula based on SCr of 0.55 mg/dL).    Allergies  Allergen Reactions  . Sulfa Antibiotics Nausea And Vomiting   Antimicrobials this admission: Keflex PTA 7/31 vancomycin >>  7/31 rifampin >>   Dose adjustments this admission: ---  Microbiology results: 7/31 surgical Cx (R knee joint): IP 7/31 Fungus stain R knee: sent    Thank you for allowing pharmacy to be a part of this patient's care.  Bernadene Personrew Yasmyn Bellisario, PharmD, BCPS Pager: [phone] 03/31/2017, 11:35 PM

## 2017-03-31 NOTE — Interval H&P Note (Signed)
History and Physical Interval Note:  03/31/2017 11:31 AM  Kathy Jennings  has presented today for surgery, with the diagnosis of Possible abscess, infected right total knee  The various methods of treatment have been discussed with the patient and family. After consideration of risks, benefits and other options for treatment, the patient has consented to  Procedure(s) with comments: Irrigation and debriement, possible poly exchange, possible revision arthroplasty and cement spacer right knee (Right) - 120 mins as a surgical intervention .  The patient's history has been reviewed, patient examined, no change in status, stable for surgery.  I have reviewed the patient's chart and labs.  Questions were answered to the patient's satisfaction.     Shelda PalLIN,Deisha Stull D

## 2017-03-31 NOTE — Transfer of Care (Signed)
Immediate Anesthesia Transfer of Care Note  Patient: Kathy BetterKathy A Waldeck  Procedure(s) Performed: Procedure(s) with comments: RIGHT KNEE IRRIGATION AND DEBRIDEMENT WITH POLY EXCHANGE (Right) - 120 mins  Patient Location: PACU  Anesthesia Type:General  Level of Consciousness: awake, alert , oriented and patient cooperative  Airway & Oxygen Therapy: Pt on mask  Post-op Assessment: Report given to RN, Post -op Vital signs reviewed and stable and Patient moving all extremities  Post vital signs: Reviewed and stable  Last Vitals:  Vitals:   03/31/17 1035  BP: (!) 157/78  Pulse: 65  Resp: 18  Temp: 36.6 C    Last Pain:  Vitals:   03/31/17 1035  TempSrc: Oral      Patients Stated Pain Goal: 4 (03/31/17 1105)  Complications: No apparent anesthesia complications

## 2017-03-31 NOTE — Anesthesia Postprocedure Evaluation (Signed)
Anesthesia Post Note  Patient: Hughes BetterKathy A Cadet  Procedure(s) Performed: Procedure(s) (LRB): RIGHT KNEE IRRIGATION AND DEBRIDEMENT WITH POLY EXCHANGE (Right)     Patient location during evaluation: PACU Anesthesia Type: General Level of consciousness: awake and alert Pain management: pain level controlled Vital Signs Assessment: post-procedure vital signs reviewed and stable Respiratory status: spontaneous breathing, nonlabored ventilation, respiratory function stable and patient connected to nasal cannula oxygen Cardiovascular status: blood pressure returned to baseline and stable Postop Assessment: no signs of nausea or vomiting Anesthetic complications: no    Last Vitals:  Vitals:   03/31/17 1545 03/31/17 1556  BP: 112/67 128/65  Pulse: 69 68  Resp: 11 12  Temp: 37 C 36.9 C    Last Pain:  Vitals:   03/31/17 1556  TempSrc: Oral  PainSc: 4                  Kennieth RadFitzgerald, Fayetta Sorenson E

## 2017-03-31 NOTE — Anesthesia Preprocedure Evaluation (Signed)
Anesthesia Evaluation  Patient identified by MRN, date of birth, ID band Patient awake    Reviewed: Allergy & Precautions, H&P , NPO status , Patient's Chart, lab work & pertinent test results  History of Anesthesia Complications (+) PONVNegative for: history of anesthetic complications  Airway Mallampati: II  TM Distance: >3 FB Neck ROM: Full    Dental  (+) Dental Advisory Given, Edentulous Upper, Missing All side teeth missing lower:   Pulmonary neg pulmonary ROS,    Pulmonary exam normal breath sounds clear to auscultation       Cardiovascular negative cardio ROS Normal cardiovascular exam Rhythm:Regular Rate:Normal     Neuro/Psych negative neurological ROS  negative psych ROS   GI/Hepatic Neg liver ROS, GERD  Medicated,  Endo/Other  negative endocrine ROS  Renal/GU negative Renal ROS     Musculoskeletal  (+) Arthritis ,   Abdominal   Peds  Hematology  (+) anemia ,   Anesthesia Other Findings   Reproductive/Obstetrics negative OB ROS                             Lab Results  Component Value Date   WBC 5.0 03/27/2017   HGB 9.9 (L) 03/27/2017   HCT 29.8 (L) 03/27/2017   MCV 80.5 03/27/2017   PLT 275 03/27/2017   Lab Results  Component Value Date   CREATININE 0.55 11/01/2014   BUN 22 11/01/2014   NA 138 11/01/2014   K 4.1 11/01/2014   CL 105 11/01/2014   CO2 27 11/01/2014    Anesthesia Physical  Anesthesia Plan  ASA: II  Anesthesia Plan: General   Post-op Pain Management:    Induction: Intravenous  PONV Risk Score and Plan:   Airway Management Planned: LMA  Additional Equipment:   Intra-op Plan:   Post-operative Plan: Extubation in OR  Informed Consent: I have reviewed the patients History and Physical, chart, labs and discussed the procedure including the risks, benefits and alternatives for the proposed anesthesia with the patient or authorized  representative who has indicated his/her understanding and acceptance.   Dental advisory given  Plan Discussed with: CRNA  Anesthesia Plan Comments:         Anesthesia Quick Evaluation

## 2017-03-31 NOTE — Anesthesia Procedure Notes (Signed)
Procedure Name: LMA Insertion Date/Time: 03/31/2017 12:31 PM Performed by: Jarvis NewcomerARMISTEAD, Daana Petrasek A Pre-anesthesia Checklist: Patient identified, Emergency Drugs available, Suction available, Patient being monitored and Timeout performed Patient Re-evaluated:Patient Re-evaluated prior to induction Oxygen Delivery Method: Circle system utilized Preoxygenation: Pre-oxygenation with 100% oxygen Induction Type: IV induction LMA: LMA with gastric port inserted LMA Size: 4.0 Number of attempts: 1 Placement Confirmation: positive ETCO2 and breath sounds checked- equal and bilateral Tube secured with: Tape Dental Injury: Teeth and Oropharynx as per pre-operative assessment

## 2017-03-31 NOTE — Brief Op Note (Signed)
03/31/2017  2:09 PM  PATIENT:  Kathy Jennings  66 y.o. female  PRE-OPERATIVE DIAGNOSIS:  Infected right total knee replacement  POST-OPERATIVE DIAGNOSIS:   Infected right total knee replacement  PROCEDURE:  Procedure(s) with comments: RIGHT KNEE IRRIGATION AND DEBRIDEMENT WITH POLY EXCHANGE (Right) - 120 mins  SURGEON:  Surgeon(s) and Role:    Durene Romans* Raymie Trani, MD - Primary  PHYSICIAN ASSISTANT: Lanney GinsMatthew Babish, PA-C  ANESTHESIA:   general  EBL:  Total I/O In: -  Out: 1610 [Urine:1550; Blood:60]  BLOOD ADMINISTERED:none  DRAINS: none   LOCAL MEDICATIONS USED:  NONE  SPECIMEN:  Source of Specimen:  right knee joint fluid  DISPOSITION OF SPECIMEN:  PATHOLOGY  COUNTS:  YES  TOURNIQUET:  60 min at 250 mmHg  DICTATION: .Other Dictation: Dictation Number [id number]  PLAN OF CARE: Admit to inpatient   PATIENT DISPOSITION:  PACU - hemodynamically stable.   Delay start of Pharmacological VTE agent (>24hrs) due to surgical blood loss or risk of bleeding: no

## 2017-04-01 ENCOUNTER — Encounter (HOSPITAL_COMMUNITY): Payer: Self-pay | Admitting: Orthopedic Surgery

## 2017-04-01 LAB — BASIC METABOLIC PANEL
Anion gap: 9 (ref 5–15)
BUN: 11 mg/dL (ref 6–20)
CALCIUM: 9.2 mg/dL (ref 8.9–10.3)
CO2: 27 mmol/L (ref 22–32)
CREATININE: 0.41 mg/dL — AB (ref 0.44–1.00)
Chloride: 102 mmol/L (ref 101–111)
GFR calc non Af Amer: >60 mL/min (ref >60)
Glucose, Bld: 118 mg/dL — ABNORMAL HIGH (ref 65–99)
Potassium: 4.1 mmol/L (ref 3.5–5.1)
SODIUM: 138 mmol/L (ref 135–145)

## 2017-04-01 LAB — CBC
HCT: 28.1 % — ABNORMAL LOW (ref 36.0–46.0)
Hemoglobin: 9 g/dL — ABNORMAL LOW (ref 12.0–15.0)
MCH: 25.4 pg — ABNORMAL LOW (ref 26.0–34.0)
MCHC: 32 g/dL (ref 30.0–36.0)
MCV: 79.4 fL (ref 78.0–100.0)
PLATELETS: 284 10*3/uL (ref 150–400)
RBC: 3.54 MIL/uL — ABNORMAL LOW (ref 3.87–5.11)
RDW: 15 % (ref 11.5–15.5)
WBC: 5.1 10*3/uL (ref 4.0–10.5)

## 2017-04-01 MED ORDER — SODIUM CHLORIDE 0.9% FLUSH
10.0000 mL | INTRAVENOUS | Status: DC | PRN
  Administered 2017-04-03: 10 mL
  Filled 2017-04-01: qty 40

## 2017-04-01 NOTE — Progress Notes (Signed)
     Subjective: 1 Day Post-Op Procedure(s) (LRB): RIGHT KNEE IRRIGATION AND DEBRIDEMENT WITH POLY EXCHANGE (Right)   Patient reports pain as mild, pain controlled. No events throughout the night.  Discussed OR findings and plans moving forward.  Objective:   VITALS:   Vitals:   04/01/17 0100 04/01/17 0644  BP: (!) 154/66 131/69  Pulse: 62 60  Resp: 16 16  Temp: 98.1 F (36.7 C) 97.8 F (36.6 C)    Dorsiflexion/Plantar flexion intact Incision: dressing C/D/I  LABS  Recent Labs  04/01/17 0649  HGB 9.0*  HCT 28.1*  WBC 5.1  PLT 284     Recent Labs  03/31/17 2151 04/01/17 0649  NA 134* 138  K 4.0 4.1  BUN 12 11  CREATININE 0.55 0.41*  GLUCOSE 229* 118*     Assessment/Plan: 1 Day Post-Op Procedure(s) (LRB): RIGHT KNEE IRRIGATION AND DEBRIDEMENT WITH POLY EXCHANGE (Right) Foley cath d/c'ed Advance diet Up with therapy D/C IV fluids Discharge home with home health     Anastasio AuerbachMatthew S. Tilly Pernice   PAC  04/01/2017, 9:28 AM

## 2017-04-01 NOTE — Evaluation (Signed)
Occupational Therapy Evaluation Patient Details Name: Hughes BetterKathy A Nghiem MRN: [id number] DOB: 1951-05-16 Today's Date: 04/01/2017    History of Present Illness Pt s/p R TKR I&D with liner exchange and with hx of R THR and R TKR   Clinical Impression   This 66 year old female was admitted for the above.  All education was completed. No further OT is needed at this time     Follow Up Recommendations  No OT follow up    Equipment Recommendations  None recommended by OT    Recommendations for Other Services       Precautions / Restrictions Precautions Precautions: Knee;Fall Restrictions Weight Bearing Restrictions: No Other Position/Activity Restrictions: WBAT      Mobility Bed Mobility Overal bed mobility: Needs Assistance Bed Mobility: Supine to Sit     Supine to sit: Min guard Sit to supine: Min assist   General bed mobility comments: assist for RLE  Transfers Overall transfer level: Needs assistance Equipment used: Rolling walker (2 wheeled) Transfers: Sit to/from Stand Sit to Stand: Min guard         General transfer comment: cues for LE management and use of UEs to self assist    Balance                                           ADL either performed or assessed with clinical judgement   ADL Overall ADL's : Needs assistance/impaired     Grooming: Oral care;Supervision/safety;Standing   Upper Body Bathing: Set up;Sitting   Lower Body Bathing: Minimal assistance;Sit to/from stand   Upper Body Dressing : Set up;Sitting   Lower Body Dressing: Moderate assistance;Sit to/from stand   Toilet Transfer: Min guard;Ambulation;Comfort height toilet;RW   Toileting- ArchitectClothing Manipulation and Hygiene: Min guard;Sit to/from stand         General ADL Comments: ambulated to bathroom.  Pt has a comfort height commode at home and used this.  Pt states that husband helps her get into tub; she does not have DME. Recommended that she sponge bathe  intially, and demonstrated tub readiness. Pt did not want a long sponge; she feels she will be able to get to her feet very soon. She is interested in reacher and long shoehorn--I will deliver these to her in the morning.  Reviewed knee precautions and walker safety; pt verbalizes understanding     Vision         Perception     Praxis      Pertinent Vitals/Pain Pain Assessment: 0-10 Pain Score: 5  Pain Location: L knee Pain Descriptors / Indicators: Aching;Sore Pain Intervention(s): Limited activity within patient's tolerance;Monitored during session;Premedicated before session;Repositioned;Ice applied     Hand Dominance     Extremity/Trunk Assessment Upper Extremity Assessment Upper Extremity Assessment: Overall WFL for tasks assessed           Communication Communication Communication: No difficulties   Cognition Arousal/Alertness: Awake/alert Behavior During Therapy: WFL for tasks assessed/performed Overall Cognitive Status: Within Functional Limits for tasks assessed                                     General Comments       Exercises    Shoulder Instructions      Home Living Family/patient expects to be discharged to:: Private  residence Living Arrangements: Spouse/significant other Available Help at Discharge: Family               Bathroom Shower/Tub: Tub/shower unit   Bathroom Toilet: Handicapped height     Home Equipment: Environmental consultantWalker - 2 wheels;Cane - single point   Additional Comments: leg lifter,       Prior Functioning/Environment Level of Independence: Independent                 OT Problem List:        OT Treatment/Interventions:      OT Goals(Current goals can be found in the care plan section) Acute Rehab OT Goals Patient Stated Goal: Regain IND OT Goal Formulation: All assessment and education complete, DC therapy  OT Frequency:     Barriers to D/C:            Co-evaluation              AM-PAC  PT "6 Clicks" Daily Activity     Outcome Measure Help from another person eating meals?: None Help from another person taking care of personal grooming?: A Little Help from another person toileting, which includes using toliet, bedpan, or urinal?: A Little Help from another person bathing (including washing, rinsing, drying)?: A Little Help from another person to put on and taking off regular upper body clothing?: A Little Help from another person to put on and taking off regular lower body clothing?: A Little 6 Click Score: 19   End of Session    Activity Tolerance: Patient tolerated treatment well Patient left: in bed;with call bell/phone within reach;with bed alarm set  OT Visit Diagnosis: Pain Pain - Right/Left: Right Pain - part of body: Knee                Time: [id number] OT Time Calculation (min): 20 min Charges:  OT General Charges $OT Visit: 1 Procedure OT Evaluation $OT Eval Low Complexity: 1 Procedure G-Codes:     MaryvilleMaryellen Khrystal Jeanmarie, OTR/L [phone] 04/01/2017  Macyn Remmert 04/01/2017, 4:23 PM

## 2017-04-01 NOTE — Progress Notes (Signed)
OT Cancellation Note  Patient Details Name: Kathy Jennings MRN: [id number] DOB: 1950-09-13   Cancelled Treatment:    Reason Eval/Treat Not Completed: Other (comment).  Pt is getting a PICC line. Will check back.  [name] 04/01/2017, 10:54 AM  [name], OTR/L [phone] 04/01/2017

## 2017-04-01 NOTE — Evaluation (Signed)
Physical Therapy Evaluation Patient Details Name: Kathy Jennings MRN: [id number] DOB: 1950-09-19 Today's Date: 04/01/2017   History of Present Illness  Pt s/p R TKR I&D with liner exchange and with hx of R THR and R TKR  Clinical Impression  Pt s/p I&D of R TKR and presents with decreased R LE strength/ROM and post op pain limiting functional mobility.  Pt should progress to dc home with family assist.    Follow Up Recommendations DC plan and follow up therapy as arranged by surgeon    Equipment Recommendations  None recommended by PT    Recommendations for Other Services OT consult     Precautions / Restrictions Precautions Precautions: Knee;Fall Restrictions Weight Bearing Restrictions: No Other Position/Activity Restrictions: WBAT      Mobility  Bed Mobility Overal bed mobility: Needs Assistance Bed Mobility: Supine to Sit     Supine to sit: Min guard     General bed mobility comments: cues for sequence and use of L LE to self assist  Transfers Overall transfer level: Needs assistance   Transfers: Sit to/from Stand Sit to Stand: Min assist         General transfer comment: cues for LE management and use of UEs to self assist  Ambulation/Gait Ambulation/Gait assistance: Min assist;Min guard Ambulation Distance (Feet): 75 Feet Assistive device: Rolling walker (2 wheeled) Gait Pattern/deviations: Step-to pattern;Step-through pattern;Decreased step length - right;Decreased step length - left;Shuffle;Trunk flexed Gait velocity: decr Gait velocity interpretation: Below normal speed for age/gender General Gait Details: cues for posture, position from RW and initial sequence  Stairs            Wheelchair Mobility    Modified Rankin (Stroke Patients Only)       Balance                                             Pertinent Vitals/Pain Pain Assessment: 0-10 Pain Score: 5  Pain Location: L knee Pain Descriptors / Indicators:  Aching;Sore Pain Intervention(s): Limited activity within patient's tolerance;Monitored during session;Premedicated before session;Ice applied    Home Living Family/patient expects to be discharged to:: Private residence Living Arrangements: Spouse/significant other Available Help at Discharge: Family Type of Home: House Home Access: Stairs to enter Entrance Stairs-Rails: None Entrance Stairs-Number of Steps: 3 Home Layout: One level Home Equipment: Environmental consultantWalker - 2 wheels;Cane - single point      Prior Function Level of Independence: Independent               Hand Dominance        Extremity/Trunk Assessment   Upper Extremity Assessment Upper Extremity Assessment: Overall WFL for tasks assessed    Lower Extremity Assessment Lower Extremity Assessment: RLE deficits/detail    Cervical / Trunk Assessment Cervical / Trunk Assessment: Normal  Communication   Communication: No difficulties  Cognition Arousal/Alertness: Awake/alert Behavior During Therapy: WFL for tasks assessed/performed Overall Cognitive Status: Within Functional Limits for tasks assessed                                        General Comments      Exercises     Assessment/Plan    PT Assessment Patient needs continued PT services  PT Problem List Decreased strength;Decreased range of motion;Decreased activity tolerance;Decreased knowledge  of use of DME;Pain       PT Treatment Interventions DME instruction;Gait training;Stair training;Functional mobility training;Therapeutic activities;Therapeutic exercise;Patient/family education    PT Goals (Current goals can be found in the Care Plan section)  Acute Rehab PT Goals Patient Stated Goal: Regain IND PT Goal Formulation: With patient Time For Goal Achievement: 04/11/17 Potential to Achieve Goals: Good    Frequency 7X/week   Barriers to discharge        Co-evaluation               AM-PAC PT "6 Clicks" Daily  Activity  Outcome Measure Difficulty turning over in bed (including adjusting bedclothes, sheets and blankets)?: A Little Difficulty moving from lying on back to sitting on the side of the bed? : A Little Difficulty sitting down on and standing up from a chair with arms (e.g., wheelchair, bedside commode, etc,.)?: Total Help needed moving to and from a bed to chair (including a wheelchair)?: A Little Help needed walking in hospital room?: A Little Help needed climbing 3-5 steps with a railing? : A Little 6 Click Score: 16    End of Session Equipment Utilized During Treatment: Gait belt Activity Tolerance: Patient tolerated treatment well Patient left: in chair;with call bell/phone within reach Nurse Communication: Mobility status PT Visit Diagnosis: Difficulty in walking, not elsewhere classified (R26.2)    Time: [id number] PT Time Calculation (min) (ACUTE ONLY): 25 min   Charges:   PT Evaluation $PT Eval Low Complexity: 1 Low PT Treatments $Gait Training: 8-22 mins   PT G Codes:        Pg [phone]   Sufyaan Palma 04/01/2017, 12:22 PM

## 2017-04-01 NOTE — Progress Notes (Signed)
Peripherally Inserted Central Catheter/Midline Placement  The IV Nurse has discussed with the patient and/or persons authorized to consent for the patient, the purpose of this procedure and the potential benefits and risks involved with this procedure.  The benefits include less needle sticks, lab draws from the catheter, and the patient may be discharged home with the catheter. Risks include, but not limited to, infection, bleeding, blood clot (thrombus formation), and puncture of an artery; nerve damage and irregular heartbeat and possibility to perform a PICC exchange if needed/ordered by physician.  Alternatives to this procedure were also discussed.  Bard Power PICC patient education guide, fact sheet on infection prevention and patient information card has been provided to patient /or left at bedside.    PICC/Midline Placement Documentation        Stacie GlazeJoyce, Paolo Okane Horton 04/01/2017, 10:54 AM

## 2017-04-01 NOTE — Progress Notes (Signed)
[facility]  Kathy Jennings is a new pt for [facility] this hospital admission. I met with the pt tonight and discussed POC for home based on current IV ABX regimen.  Provided pt with private pay rate for Vancomycin 1 Gram IV Q 12 hours including all IV supplies. Pt acknowledged she could pay with payment plan.   We will work with pt to arrange weekly outpatient labs and PICC care at [facility] as first choice since pt lives in [place] or [place] [facility]. I will see pt tomorrow morning for additional teaching for independence and to work with hospital team on final IV ABX regimen for home.    If patient discharges after hours, please call [phone].   Larry Sierras 04/01/2017, 10:06 PM

## 2017-04-01 NOTE — Progress Notes (Signed)
Physical Therapy Treatment Patient Details Name: Kathy Jennings MRN: [id number] DOB: 05/17/1951 Today's Date: 04/01/2017    History of Present Illness Pt s/p R TKR I&D with liner exchange and with hx of R THR and R TKR    PT Comments    Pt very motivated and progressing well with mobility.   Follow Up Recommendations  DC plan and follow up therapy as arranged by surgeon     Equipment Recommendations  None recommended by PT    Recommendations for Other Services OT consult     Precautions / Restrictions Precautions Precautions: Knee;Fall Restrictions Weight Bearing Restrictions: No Other Position/Activity Restrictions: WBAT    Mobility  Bed Mobility Overal bed mobility: Needs Assistance Bed Mobility: Supine to Sit     Supine to sit: Min guard     General bed mobility comments: cues for sequence and use of L LE to self assist  Transfers Overall transfer level: Needs assistance Equipment used: Rolling walker (2 wheeled) Transfers: Sit to/from Stand Sit to Stand: Min assist;Min guard         General transfer comment: cues for LE management and use of UEs to self assist  Ambulation/Gait Ambulation/Gait assistance: Min assist;Min guard Ambulation Distance (Feet): 150 Feet (and 15' into bathroom) Assistive device: Rolling walker (2 wheeled) Gait Pattern/deviations: Step-to pattern;Step-through pattern;Decreased step length - right;Decreased step length - left;Shuffle;Trunk flexed Gait velocity: decr Gait velocity interpretation: Below normal speed for age/gender General Gait Details: cues for posture, position from RW and initial sequence   Stairs            Wheelchair Mobility    Modified Rankin (Stroke Patients Only)       Balance                                            Cognition Arousal/Alertness: Awake/alert Behavior During Therapy: WFL for tasks assessed/performed Overall Cognitive Status: Within Functional Limits  for tasks assessed                                        Exercises Total Joint Exercises Ankle Circles/Pumps: AROM;Both;15 reps;Supine Quad Sets: AROM;Both;10 reps;Supine Heel Slides: AAROM;Right;15 reps;Supine Straight Leg Raises: AAROM;Right;Supine;10 reps    General Comments        Pertinent Vitals/Pain Pain Assessment: 0-10 Pain Score: 5  Pain Location: L knee Pain Descriptors / Indicators: Aching;Sore Pain Intervention(s): Limited activity within patient's tolerance;Monitored during session;Premedicated before session;Ice applied    Home Living Family/patient expects to be discharged to:: Private residence Living Arrangements: Spouse/significant other Available Help at Discharge: Family Type of Home: House Home Access: Stairs to enter Entrance Stairs-Rails: None Home Layout: One level Home Equipment: Environmental consultantWalker - 2 wheels;Cane - single point Additional Comments: leg lifter,     Prior Function Level of Independence: Independent          PT Goals (current goals can now be found in the care plan section) Acute Rehab PT Goals Patient Stated Goal: Regain IND PT Goal Formulation: With patient Time For Goal Achievement: 04/11/17 Potential to Achieve Goals: Good Progress towards PT goals: Progressing toward goals    Frequency    7X/week      PT Plan Current plan remains appropriate    Co-evaluation  AM-PAC PT "6 Clicks" Daily Activity  Outcome Measure  Difficulty turning over in bed (including adjusting bedclothes, sheets and blankets)?: A Little Difficulty moving from lying on back to sitting on the side of the bed? : A Little Difficulty sitting down on and standing up from a chair with arms (e.g., wheelchair, bedside commode, etc,.)?: Total Help needed moving to and from a bed to chair (including a wheelchair)?: A Little Help needed walking in hospital room?: A Little Help needed climbing 3-5 steps with a railing? : A  Little 6 Click Score: 16    End of Session Equipment Utilized During Treatment: Gait belt Activity Tolerance: Patient tolerated treatment well Patient left: in chair;with call bell/phone within reach Nurse Communication: Mobility status PT Visit Diagnosis: Difficulty in walking, not elsewhere classified (R26.2)     Time: 1350-1435 PT Time Calculation (min) (ACUTE ONLY): 45 min  Charges:  $Gait Training: 8-22 mins $Therapeutic Exercise: 8-22 mins $Therapeutic Activity: 8-22 mins                    G Codes:       Pg [phone]    Kathy Jennings 04/01/2017, 3:28 PM

## 2017-04-02 DIAGNOSIS — Z882 Allergy status to sulfonamides status: Secondary | ICD-10-CM

## 2017-04-02 DIAGNOSIS — Y838 Other surgical procedures as the cause of abnormal reaction of the patient, or of later complication, without mention of misadventure at the time of the procedure: Secondary | ICD-10-CM

## 2017-04-02 DIAGNOSIS — T8453XA Infection and inflammatory reaction due to internal right knee prosthesis, initial encounter: Principal | ICD-10-CM

## 2017-04-02 LAB — BASIC METABOLIC PANEL
Anion gap: 5 (ref 5–15)
BUN: 17 mg/dL (ref 6–20)
CHLORIDE: 103 mmol/L (ref 101–111)
CO2: 27 mmol/L (ref 22–32)
Calcium: 8.5 mg/dL — ABNORMAL LOW (ref 8.9–10.3)
Creatinine, Ser: 0.52 mg/dL (ref 0.44–1.00)
GFR calc Af Amer: >60 mL/min (ref >60)
GLUCOSE: 195 mg/dL — AB (ref 65–99)
POTASSIUM: 3.8 mmol/L (ref 3.5–5.1)
Sodium: 135 mmol/L (ref 135–145)

## 2017-04-02 LAB — FUNGUS STAIN

## 2017-04-02 LAB — SEDIMENTATION RATE: Sed Rate: 68 mm/hr — ABNORMAL HIGH (ref 0–22)

## 2017-04-02 LAB — CBC
HCT: 25.1 % — ABNORMAL LOW (ref 36.0–46.0)
Hemoglobin: 8.3 g/dL — ABNORMAL LOW (ref 12.0–15.0)
MCH: 26.8 pg (ref 26.0–34.0)
MCHC: 33.1 g/dL (ref 30.0–36.0)
MCV: 81 fL (ref 78.0–100.0)
PLATELETS: 278 10*3/uL (ref 150–400)
RBC: 3.1 MIL/uL — AB (ref 3.87–5.11)
RDW: 15.6 % — ABNORMAL HIGH (ref 11.5–15.5)
WBC: 5.3 10*3/uL (ref 4.0–10.5)

## 2017-04-02 LAB — FUNGAL STAIN REFLEX

## 2017-04-02 LAB — C-REACTIVE PROTEIN: CRP: 2.5 mg/dL — ABNORMAL HIGH (ref <1.0)

## 2017-04-02 MED ORDER — CIPROFLOXACIN HCL 500 MG PO TABS
500.0000 mg | ORAL_TABLET | Freq: Two times a day (BID) | ORAL | Status: DC
  Administered 2017-04-02 – 2017-04-03 (×2): 500 mg via ORAL
  Filled 2017-04-02 (×2): qty 1

## 2017-04-02 NOTE — Progress Notes (Signed)
Physical Therapy Treatment Patient Details Name: Kathy Jennings MRN: [id number] DOB: 22-Mar-1951 Today's Date: 04/02/2017    History of Present Illness Pt s/p R TKR I&D with liner exchange and with hx of R THR and R TKR    PT Comments    Pt continues very motivated, progressing well with mobility and hopeful for dc home tomorrow.   Follow Up Recommendations  DC plan and follow up therapy as arranged by surgeon     Equipment Recommendations  None recommended by PT    Recommendations for Other Services OT consult     Precautions / Restrictions Precautions Precautions: Knee;Fall Restrictions Weight Bearing Restrictions: No Other Position/Activity Restrictions: WBAT    Mobility  Bed Mobility Overal bed mobility: Needs Assistance Bed Mobility: Supine to Sit;Sit to Supine     Supine to sit: Supervision Sit to supine: Min guard      Transfers Overall transfer level: Needs assistance Equipment used: Rolling walker (2 wheeled) Transfers: Sit to/from Stand Sit to Stand: Supervision         General transfer comment: cues for LE management and use of UEs to self assist  Ambulation/Gait Ambulation/Gait assistance: Min guard Ambulation Distance (Feet): 400 Feet Assistive device: Rolling walker (2 wheeled) Gait Pattern/deviations: Step-to pattern;Step-through pattern;Decreased step length - right;Decreased step length - left;Shuffle;Trunk flexed Gait velocity: decr Gait velocity interpretation: Below normal speed for age/gender General Gait Details: cues for posture, position from RW and initial sequence   Stairs            Wheelchair Mobility    Modified Rankin (Stroke Patients Only)       Balance                                            Cognition Arousal/Alertness: Awake/alert Behavior During Therapy: WFL for tasks assessed/performed Overall Cognitive Status: Within Functional Limits for tasks assessed                                         Exercises Total Joint Exercises Ankle Circles/Pumps: AROM;Both;15 reps;Supine Quad Sets: AROM;Both;Supine;15 reps Heel Slides: AAROM;Right;Supine;20 reps Hip ABduction/ADduction: AAROM;AROM;Right;10 reps;Supine Straight Leg Raises: Right;Supine;AAROM;AROM;20 reps Goniometric ROM: AAROM R knee -8- 70    General Comments        Pertinent Vitals/Pain Pain Assessment: 0-10 Pain Score: 5  Pain Location: L knee Pain Descriptors / Indicators: Aching;Sore Pain Intervention(s): Monitored during session;Limited activity within patient's tolerance;Premedicated before session;Ice applied    Home Living                      Prior Function            PT Goals (current goals can now be found in the care plan section) Acute Rehab PT Goals Patient Stated Goal: Regain IND PT Goal Formulation: With patient Time For Goal Achievement: 04/11/17 Potential to Achieve Goals: Good Progress towards PT goals: Progressing toward goals    Frequency    7X/week      PT Plan Current plan remains appropriate    Co-evaluation              AM-PAC PT "6 Clicks" Daily Activity  Outcome Measure  Difficulty turning over in bed (including adjusting bedclothes, sheets and blankets)?: A Little Difficulty  moving from lying on back to sitting on the side of the bed? : A Little Difficulty sitting down on and standing up from a chair with arms (e.g., wheelchair, bedside commode, etc,.)?: A Little Help needed moving to and from a bed to chair (including a wheelchair)?: A Little Help needed walking in hospital room?: A Little Help needed climbing 3-5 steps with a railing? : A Little 6 Click Score: 18    End of Session Equipment Utilized During Treatment: Gait belt Activity Tolerance: Patient tolerated treatment well Patient left: in bed;with call bell/phone within reach;with family/visitor present Nurse Communication: Mobility status PT Visit Diagnosis:  Difficulty in walking, not elsewhere classified (R26.2)     Time: 1338-1410 PT Time Calculation (min) (ACUTE ONLY): 32 min  Charges:  $Gait Training: 8-22 mins $Therapeutic Exercise: 8-22 mins                    G Codes:       Pg [phone]    Makhayla Mcmurry 04/02/2017, 3:45 PM

## 2017-04-02 NOTE — Consult Note (Signed)
[facility] for Infectious Disease  Total days of antibiotics 3        Day 3 vanco        Day 3 rif               Reason for Consult:pji    Referring Physician: olin  Active Problems:   Infected prosthetic knee joint ([place])    HPI: Kathy Jennings is a 66 y.o. female with history of OA s/p right knee TKA on 3/16. She had been doing well up until June 10th when she had acute onset of flu like symptoms followed by  worsening right knee pain and swellingbut no drainage though slight erythema around lower aspect of knee. She went to the ED on 6/15 and was given cortisone injection with prednisone taper. She has failed conservative management of NSAIDS and steroids since she had ongoing pain and swelling to her right knee/leg. Roughly, 2 weeks prior to this admission, she started to have a boil over distal aspect of incision with spontaneously drained with purulence needing frequent dressing changes. She was seen by dr Alvan Dame in his office, where she underwent aspiration of her joint with small amount of blood removed with cell count of 3500 and synovasure evaluation is negative.she was given a course of oral cephalexin but there was high suspicion for deep knee infection at that time. She was admitted for I x D of right knee with polyethylene-exchange and started on vancomycin plus rifampin. She is colonized with staph aureus (not MRSA), Or cx shows wbc on gram stains but no organism. She was on abtx prior to coming into the hospital. She denies fever, chills, nighsweats.  Past Medical History:  Diagnosis Date  . Arthritis   . Complication of anesthesia    slow to wake up  . GERD (gastroesophageal reflux disease)   . OAB (overactive bladder)   . Peripheral vascular disease ([place])   . PONV (postoperative nausea and vomiting)   . Urgency of urination   . Varicose veins     Allergies:  Allergies  Allergen Reactions  . Sulfa Antibiotics Nausea And Vomiting    MEDICATIONS: . aspirin   81 mg Oral BID  . docusate sodium  100 mg Oral BID  . HYDROcodone-acetaminophen  1-2 tablet Oral Q4H  . loratadine  10 mg Oral Q lunch  . omeprazole  20 mg Oral Q24H  . polyethylene glycol  17 g Oral BID  . rifampin  300 mg Oral Q12H    Social History  Substance Use Topics  . Smoking status: Never Smoker  . Smokeless tobacco: Never Used  . Alcohol use No   Family hx: no history of immune suppression  Review of Systems  Constitutional: Negative for fever, chills, diaphoresis, activity change, appetite change, fatigue and unexpected weight change.  HENT: Negative for congestion, sore throat, rhinorrhea, sneezing, trouble swallowing and sinus pressure.  Eyes: Negative for photophobia and visual disturbance.  Respiratory: Negative for cough, chest tightness, shortness of breath, wheezing and stridor.  Cardiovascular: Negative for chest pain, palpitations and leg swelling.  Gastrointestinal: Negative for nausea, vomiting, abdominal pain, diarrhea, constipation, blood in stool, abdominal distention and anal bleeding.  Genitourinary: Negative for dysuria, hematuria, flank pain and difficulty urinating.  Musculoskeletal: Negative for myalgias, back pain, joint swelling, arthralgias and gait problem.  Skin: Negative for color change, pallor, rash and wound.  Ext: right leg swelling Neurological: Negative for dizziness, tremors, weakness and light-headedness.  Hematological: Negative for adenopathy.  Does not bruise/bleed easily.  Psychiatric/Behavioral: Negative for behavioral problems, confusion, sleep disturbance, dysphoric mood, decreased concentration and agitation.     OBJECTIVE: Temp:  [98.3 F (36.8 C)-98.5 F (36.9 C)] 98.5 F (36.9 C) (08/02 0607) Pulse Rate:  [58-67] 67 (08/02 0607) Resp:  [16] 16 (08/02 0607) BP: (125-171)/(70-87) 171/76 (08/02 0607) SpO2:  [97 %-100 %] 97 % (08/02 3893) Physical Exam  Constitutional:  oriented to person, place, and time. appears  well-developed and well-nourished. No distress.  HENT: [place]/AT, PERRLA, no scleral icterus Mouth/Throat: Oropharynx is clear and moist. No oropharyngeal exudate.  Cardiovascular: Normal rate, regular rhythm and normal heart sounds. Exam reveals no gallop and no friction rub.  No murmur heard.  Pulmonary/Chest: Effort normal and breath sounds normal. No respiratory distress.  has no wheezes.  Neck = supple, no nuchal rigidity Abdominal: Soft. Bowel sounds are normal.  exhibits no distension. There is no tenderness.  Lymphadenopathy: no cervical adenopathy. No axillary adenopathy Ext: right knee swelling and bandage noted at incision. No surrounding erythema Neurological: alert and oriented to person, place, and time.  Skin: Skin is warm and dry. No rash noted. No erythema.  Psychiatric: a normal mood and affect.  behavior is normal.   LABS: Results for orders placed or performed during the hospital encounter of 03/31/17 (from the past 48 hour(s))  Aerobic/Anaerobic Culture (surgical/deep wound)     Status: None (Preliminary result)   Collection Time: 03/31/17  1:16 PM  Result Value Ref Range   Specimen Description KNEE RIGHT    Special Requests NONE    Gram Stain      MODERATE WBC PRESENT,BOTH PMN AND MONONUCLEAR NO ORGANISMS SEEN    Culture      NO GROWTH < 24 HOURS Performed at [facility] [address] [place], [place] [address]    Report Status PENDING   Fungus Stain     Status: None   Collection Time: 03/31/17  1:16 PM  Result Value Ref Range   FUNGUS STAIN Final report     Comment: (NOTE) Performed At: [facility] [place], [place] [id number] Lindon Romp MD [id number]    Fungal Source KNEE     Comment: RIGHT  Fungal Stain reflex     Status: None   Collection Time: 03/31/17  1:16 PM  Result Value Ref Range   Fungal stain result 1 Comment     Comment: (NOTE) KOH/Calcofluor preparation:  no fungus observed. Performed At: [facility] [place], [place] [id number] Lindon Romp MD OZ:[id number]   Basic metabolic panel     Status: Abnormal   Collection Time: 03/31/17  9:51 PM  Result Value Ref Range   Sodium 134 (L) 135 - 145 mmol/L   Potassium 4.0 3.5 - 5.1 mmol/L   Chloride 101 101 - 111 mmol/L   CO2 26 22 - 32 mmol/L   Glucose, Bld 229 (H) 65 - 99 mg/dL   BUN 12 6 - 20 mg/dL   Creatinine, Ser 0.55 0.44 - 1.00 mg/dL   Calcium 8.7 (L) 8.9 - 10.3 mg/dL   GFR calc non Af Amer >60 >60 mL/min   GFR calc Af Amer >60 >60 mL/min    Comment: (NOTE) The eGFR has been calculated using the CKD EPI equation. This calculation has not been validated in all clinical situations. eGFR's persistently <60 mL/min signify possible Chronic Kidney Disease.    Anion gap 7 5 - 15  CBC  Status: Abnormal   Collection Time: 04/01/17  6:49 AM  Result Value Ref Range   WBC 5.1 4.0 - 10.5 K/uL   RBC 3.54 (L) 3.87 - 5.11 MIL/uL   Hemoglobin 9.0 (L) 12.0 - 15.0 g/dL   HCT 28.1 (L) 36.0 - 46.0 %   MCV 79.4 78.0 - 100.0 fL   MCH 25.4 (L) 26.0 - 34.0 pg   MCHC 32.0 30.0 - 36.0 g/dL   RDW 15.0 11.5 - 15.5 %   Platelets 284 150 - 400 K/uL  Basic metabolic panel     Status: Abnormal   Collection Time: 04/01/17  6:49 AM  Result Value Ref Range   Sodium 138 135 - 145 mmol/L   Potassium 4.1 3.5 - 5.1 mmol/L   Chloride 102 101 - 111 mmol/L   CO2 27 22 - 32 mmol/L   Glucose, Bld 118 (H) 65 - 99 mg/dL   BUN 11 6 - 20 mg/dL   Creatinine, Ser 0.41 (L) 0.44 - 1.00 mg/dL   Calcium 9.2 8.9 - 10.3 mg/dL   GFR calc non Af Amer >60 >60 mL/min   GFR calc Af Amer >60 >60 mL/min    Comment: (NOTE) The eGFR has been calculated using the CKD EPI equation. This calculation has not been validated in all clinical situations. eGFR's persistently <60 mL/min signify possible Chronic Kidney Disease.    Anion gap 9 5 - 15  CBC     Status: Abnormal   Collection Time: 04/02/17  4:05 AM  Result Value Ref Range   WBC 5.3  4.0 - 10.5 K/uL   RBC 3.10 (L) 3.87 - 5.11 MIL/uL   Hemoglobin 8.3 (L) 12.0 - 15.0 g/dL   HCT 25.1 (L) 36.0 - 46.0 %   MCV 81.0 78.0 - 100.0 fL   MCH 26.8 26.0 - 34.0 pg   MCHC 33.1 30.0 - 36.0 g/dL   RDW 15.6 (H) 11.5 - 15.5 %   Platelets 278 150 - 400 K/uL  Basic metabolic panel     Status: Abnormal   Collection Time: 04/02/17  4:05 AM  Result Value Ref Range   Sodium 135 135 - 145 mmol/L   Potassium 3.8 3.5 - 5.1 mmol/L   Chloride 103 101 - 111 mmol/L   CO2 27 22 - 32 mmol/L   Glucose, Bld 195 (H) 65 - 99 mg/dL   BUN 17 6 - 20 mg/dL   Creatinine, Ser 0.52 0.44 - 1.00 mg/dL   Calcium 8.5 (L) 8.9 - 10.3 mg/dL   GFR calc non Af Amer >60 >60 mL/min   GFR calc Af Amer >60 >60 mL/min    Comment: (NOTE) The eGFR has been calculated using the CKD EPI equation. This calculation has not been validated in all clinical situations. eGFR's persistently <60 mL/min signify possible Chronic Kidney Disease.    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  - will get sed rate and crp  for baseline  Therapeutic drug monitoring = vanco trough to be drawn prior to next dose. Goal of 15-20 for trough. Dose adjust if needed. As an outpatient we will be doing twice a week bmp to watch/avoid for nephrotoxicity associated with vanco  Plan has been discussed with home health coordinator who will arrange iv abtx and send info to our clinic for monitoring  ----------------------------------- Diagnosis: PJI  Culture  Result: culture negative  Allergies  Allergen Reactions  . Sulfa Antibiotics Nausea And Vomiting    OPAT Orders Discharge antibiotics: Per pharmacy protocol  Aim for Vancomycin trough 15-20 (unless otherwise indicated) Duration: 6 wk End Date: Sept 13th  [facility] Care Per Protocol:  Labs weekly while on IV antibiotics: _x_ CBC with differential _x_ BMP twice a week __ CMP _x_ CRP _x_ ESR _x_ Vancomycin trough  x__ Please pull PIC at completion of IV antibiotics __ Please leave PIC in place until doctor has seen patient or been notified  Fax weekly labs to [phone]  Clinic Follow Up Appt: in 4-6 wk with Zeyna Mkrtchyan   @ [facility]

## 2017-04-02 NOTE — Progress Notes (Addendum)
Pharmacy Antibiotic Note  Kathy Jennings is a 66 y.o. female admitted on 03/31/2017 with pain and drainage from surgical site of TKA (11/15/14) since 02/08/17.  She was taking Keflex PTA.  S/p I&D of right knee with poly exchange on 7/31.  Pharmacy has been consulted for Vancomycin dosing, MD dosing Rifampin and Cipro.  Plan: Continue Vancomycin 1g IV q12h. Measure Vanc trough at steady state.  Goal VT 15-20 mcg/mL Follow up renal fxn, culture results, and clinical course. Follow up dosing for IV vancomycin at discharge in OPAT note. Continue Rifampin PO and Cipro PO per MD orders.  Height: 5\' 4"  (162.6 cm) Weight: 195 lb (88.5 kg) IBW/kg (Calculated) : 54.7  Temp (24hrs), Avg:98.4 F (36.9 C), Min:98.3 F (36.8 C), Max:98.5 F (36.9 C)   Recent Labs Lab 03/27/17 0837 03/31/17 2151 04/01/17 0649 04/02/17 0405  WBC 5.0  --  5.1 5.3  CREATININE  --  0.55 0.41* 0.52    Estimated Creatinine Clearance: 77.5 mL/min (by C-G formula based on SCr of 0.52 mg/dL).    Allergies  Allergen Reactions  . Sulfa Antibiotics Nausea And Vomiting    Antimicrobials this admission: Keflex PTA 7/31 vancomycin >>  7/31 rifampin >>  8/2 Cipro >>  Dose adjustments this admission: 8/3 0030 VT = _____  Microbiology results: 7/31 fungus stain: no fungus obserbed 7/31 joint fluid cx: ngtd 7/27 Surgical MRSA PCR: neg, but + for Staph aureus   Thank you for allowing pharmacy to be a part of this patient's care.  Lynann Beaverhristine Madelena Maturin PharmD, BCPS Pager [phone] 04/02/2017 1:19 PM

## 2017-04-02 NOTE — Progress Notes (Signed)
Physical Therapy Treatment Patient Details Name: Kathy Jennings MRN: [id number] DOB: 1951-03-19 Today's Date: 04/02/2017    History of Present Illness Pt s/p R TKR I&D with liner exchange and with hx of R THR and R TKR    PT Comments    Pt continues very motivated and progressing well with mobility.   Follow Up Recommendations  DC plan and follow up therapy as arranged by surgeon     Equipment Recommendations  None recommended by PT    Recommendations for Other Services OT consult     Precautions / Restrictions Precautions Precautions: Knee;Fall Restrictions Weight Bearing Restrictions: No Other Position/Activity Restrictions: WBAT    Mobility  Bed Mobility               General bed mobility comments: OOB with nursing  Transfers Overall transfer level: Needs assistance Equipment used: Rolling walker (2 wheeled) Transfers: Sit to/from Stand Sit to Stand: Min guard         General transfer comment: cues for LE management and use of UEs to self assist  Ambulation/Gait Ambulation/Gait assistance: Min guard Ambulation Distance (Feet): 200 Feet Assistive device: Rolling walker (2 wheeled) Gait Pattern/deviations: Step-to pattern;Step-through pattern;Decreased step length - right;Decreased step length - left;Shuffle;Trunk flexed Gait velocity: decr Gait velocity interpretation: Below normal speed for age/gender General Gait Details: cues for posture, position from RW and initial sequence   Stairs            Wheelchair Mobility    Modified Rankin (Stroke Patients Only)       Balance                                            Cognition Arousal/Alertness: Awake/alert Behavior During Therapy: WFL for tasks assessed/performed Overall Cognitive Status: Within Functional Limits for tasks assessed                                        Exercises Total Joint Exercises Ankle Circles/Pumps: AROM;Both;15  reps;Supine Quad Sets: AROM;Both;Supine;15 reps Heel Slides: AAROM;Right;Supine;20 reps Straight Leg Raises: Right;Supine;AAROM;AROM;20 reps    General Comments        Pertinent Vitals/Pain Pain Assessment: 0-10 Pain Score: 6  Pain Location: L knee Pain Descriptors / Indicators: Aching;Sore Pain Intervention(s): Limited activity within patient's tolerance;Monitored during session;Premedicated before session;Ice applied    Home Living                      Prior Function            PT Goals (current goals can now be found in the care plan section) Acute Rehab PT Goals Patient Stated Goal: Regain IND PT Goal Formulation: With patient Time For Goal Achievement: 04/11/17 Potential to Achieve Goals: Good Progress towards PT goals: Progressing toward goals    Frequency    7X/week      PT Plan Current plan remains appropriate    Co-evaluation              AM-PAC PT "6 Clicks" Daily Activity  Outcome Measure  Difficulty turning over in bed (including adjusting bedclothes, sheets and blankets)?: A Little Difficulty moving from lying on back to sitting on the side of the bed? : A Little Difficulty sitting down on and standing up  from a chair with arms (e.g., wheelchair, bedside commode, etc,.)?: Total Help needed moving to and from a bed to chair (including a wheelchair)?: A Little Help needed walking in hospital room?: A Little Help needed climbing 3-5 steps with a railing? : A Little 6 Click Score: 16    End of Session Equipment Utilized During Treatment: Gait belt Activity Tolerance: Patient tolerated treatment well Patient left: in chair;with call bell/phone within reach Nurse Communication: Mobility status PT Visit Diagnosis: Difficulty in walking, not elsewhere classified (R26.2)     Time: [id number] PT Time Calculation (min) (ACUTE ONLY): 37 min  Charges:  $Gait Training: 8-22 mins $Therapeutic Exercise: 8-22 mins                    G  Codes:       Pg [phone]    Kathy Jennings 04/02/2017, 11:38 AM

## 2017-04-02 NOTE — Progress Notes (Signed)
Patient ID: Kathy Jennings, female   DOB: 12-09-1950, 66 y.o.   MRN: [id number] Subjective: 2 Days Post-Op Procedure(s) (LRB): RIGHT KNEE IRRIGATION AND DEBRIDEMENT WITH POLY EXCHANGE (Right)    Patient reports pain as mild to moderate.  Doing fine with therapy.  Waiting for cultures to turn. ID consult pending. PIC line in place Care management in place awaiting discharge  Objective:   VITALS:   Vitals:   04/01/17 2225 04/02/17 0607  BP: 135/70 (!) 171/76  Pulse: (!) 58 67  Resp: 16 16  Temp: 98.3 F (36.8 C) 98.5 F (36.9 C)    Neurovascular intact Incision: dressing C/D/I - will have nursing remove her dressing down to aquacell this am  LABS  Recent Labs  04/01/17 0649 04/02/17 0405  HGB 9.0* 8.3*  HCT 28.1* 25.1*  WBC 5.1 5.3  PLT 284 278     Recent Labs  03/31/17 2151 04/01/17 0649 04/02/17 0405  NA 134* 138 135  K 4.0 4.1 3.8  BUN 12 11 17   CREATININE 0.55 0.41* 0.52  GLUCOSE 229* 118* 195*    No results for input(s): LABPT, INR in the last 72 hours.   Assessment/Plan: 2 Days Post-Op Procedure(s) (LRB): RIGHT KNEE IRRIGATION AND DEBRIDEMENT WITH POLY EXCHANGE (Right)   Advance diet Up with therapy Continue foley due to infected right total knee - Vanco for now  Will call for ID consult this am Await for culture results Anticipate discharge for tomorrow if all arranged

## 2017-04-02 NOTE — Progress Notes (Signed)
PHARMACY CONSULT NOTE FOR:  OUTPATIENT  PARENTERAL ANTIBIOTIC THERAPY (OPAT)  Indication: prosthetic joint infection Regimen: Vancomycin 1500mg  IV q12h End date: 05/14/17  IV antibiotic discharge orders are pended. To discharging provider:  please sign these orders via discharge navigator,  Select New Orders & click on the button choice - Manage This Unsigned Work.    Thank you for allowing pharmacy to be a part of this patient's care.  Lynann Beaverhristine Leinaala Catanese PharmD, BCPS Pager [phone] 04/03/2017 7:10 AM

## 2017-04-03 LAB — VANCOMYCIN, TROUGH: VANCOMYCIN TR: 10 ug/mL — AB (ref 15–20)

## 2017-04-03 MED ORDER — HEPARIN SOD (PORK) LOCK FLUSH 100 UNIT/ML IV SOLN: 250.0000 [IU] | INTRAVENOUS | Status: DC | PRN

## 2017-04-03 MED ORDER — HYDROCODONE-ACETAMINOPHEN 7.5-325 MG PO TABS: 1.0000 | ORAL_TABLET | ORAL | 0 refills | Status: DC | PRN

## 2017-04-03 MED ORDER — DOCUSATE SODIUM 100 MG PO CAPS: 100.0000 mg | ORAL_CAPSULE | Freq: Two times a day (BID) | ORAL | 0 refills | Status: DC

## 2017-04-03 MED ORDER — RIFAMPIN 300 MG PO CAPS: 300.0000 mg | ORAL_CAPSULE | Freq: Two times a day (BID) | ORAL | 0 refills | Status: DC

## 2017-04-03 MED ORDER — CIPROFLOXACIN HCL 500 MG PO TABS: 500.0000 mg | ORAL_TABLET | Freq: Two times a day (BID) | ORAL | 0 refills | Status: DC

## 2017-04-03 MED ORDER — VANCOMYCIN IV (FOR PTA / DISCHARGE USE ONLY): 1500.0000 mg | Freq: Two times a day (BID) | INTRAVENOUS | 0 refills | Status: DC

## 2017-04-03 MED ORDER — METHOCARBAMOL 500 MG PO TABS: 500.0000 mg | ORAL_TABLET | Freq: Four times a day (QID) | ORAL | 0 refills | Status: DC | PRN

## 2017-04-03 MED ORDER — ASPIRIN 81 MG PO CHEW: 81.0000 mg | CHEWABLE_TABLET | Freq: Two times a day (BID) | ORAL | 0 refills | Status: DC

## 2017-04-03 MED ORDER — HEPARIN SOD (PORK) LOCK FLUSH 100 UNIT/ML IV SOLN
250.0000 [IU] | INTRAVENOUS | Status: AC | PRN
  Administered 2017-04-03: 250 [IU]

## 2017-04-03 MED ORDER — VANCOMYCIN HCL 10 G IV SOLR
1500.0000 mg | Freq: Two times a day (BID) | INTRAVENOUS | Status: DC
  Administered 2017-04-03 (×2): 1500 mg via INTRAVENOUS
  Filled 2017-04-03 (×3): qty 1500

## 2017-04-03 MED ORDER — POLYETHYLENE GLYCOL 3350 17 G PO PACK: 17.0000 g | PACK | Freq: Two times a day (BID) | ORAL | 0 refills | Status: DC

## 2017-04-03 NOTE — Progress Notes (Signed)
     Subjective: 3 Days Post-Op Procedure(s) (LRB): RIGHT KNEE IRRIGATION AND DEBRIDEMENT WITH POLY EXCHANGE (Right)   Patient reports pain as mild, pain controlled. No events throughout the night.  Feels that she is working well with PT.  All home arrangements are being set up.  Ready to be discharged home.   Objective:   VITALS:   Vitals:   04/02/17 2248 04/03/17 0612  BP: (!) 134/57 138/63  Pulse: 71 63  Resp: 16 16  Temp: 98.2 F (36.8 C) 98.3 F (36.8 C)    Dorsiflexion/Plantar flexion intact Incision: dressing C/D/I No cellulitis present Compartment soft  LABS  Recent Labs  04/01/17 0649 04/02/17 0405  HGB 9.0* 8.3*  HCT 28.1* 25.1*  WBC 5.1 5.3  PLT 284 278     Recent Labs  03/31/17 2151 04/01/17 0649 04/02/17 0405  NA 134* 138 135  K 4.0 4.1 3.8  BUN 12 11 17   CREATININE 0.55 0.41* 0.52  GLUCOSE 229* 118* 195*     Assessment/Plan: 3 Days Post-Op Procedure(s) (LRB): RIGHT KNEE IRRIGATION AND DEBRIDEMENT WITH POLY EXCHANGE (Right) Up with therapy Discharge home with home health and outpatient services [facility] Follow up with OLIN,Linzee Depaul D in 2 weeks.  Contact information:  [facility] [address], [address] [place] [id number] [phone]        Anastasio AuerbachMatthew S. Avabella Wailes   PAC  04/03/2017, 9:47 AM

## 2017-04-03 NOTE — Progress Notes (Signed)
Pharmacy Antibiotic Note  Kathy Jennings is a 66 y.o. female admitted on 03/31/2017 with Infected TKA.  Pharmacy has been consulted for vancomycin dosing.  Plan: Change vancomycin to 1500mg  iv q12hr  Height: 5\' 4"  (162.6 cm) Weight: 195 lb (88.5 kg) IBW/kg (Calculated) : 54.7  Temp (24hrs), Avg:98.3 F (36.8 C), Min:98.2 F (36.8 C), Max:98.5 F (36.9 C)   Recent Labs Lab 03/27/17 0837 03/31/17 2151 04/01/17 0649 04/02/17 0405 04/03/17 0025  WBC 5.0  --  5.1 5.3  --   CREATININE  --  0.55 0.41* 0.52  --   VANCOTROUGH  --   --   --   --  10*    Estimated Creatinine Clearance: 77.5 mL/min (by C-G formula based on SCr of 0.52 mg/dL).    Allergies  Allergen Reactions  . Sulfa Antibiotics Nausea And Vomiting    Antimicrobials this admission:  Keflex PTA 7/31 vancomycin >>  7/31 rifampin >>  8/2 Cipro >>  Dose adjustments this admission: 8/2 PM VT = 10  On 1gm iv q12hr  Microbiology results: 7/31 fungus stain: no fungus obserbed 7/31 joint fluid cx: ngtd 7/27 Surgical MRSA PCR: neg, but + for Staph aureus  Thank you for allowing pharmacy to be a part of this patient's care.  Aleene DavidsonGrimsley Jr, Brittnae Aschenbrenner Crowford 04/03/2017 1:11 AM

## 2017-04-03 NOTE — Progress Notes (Signed)
Physical Therapy Treatment Patient Details Name: Kathy BetterKathy A Tippins MRN: [id number] DOB: August 16, 1951 Today's Date: 04/03/2017    History of Present Illness Pt s/p R TKR I&D with liner exchange and with hx of R THR and R TKR    PT Comments    Pt fatigued this am but continues to progress with mobility.  Pt to/from bathroom, ambulated in hall, negotiated stairs and reviewed home therex.  Pt looking fwd to dc home this date.   Follow Up Recommendations  DC plan and follow up therapy as arranged by surgeon     Equipment Recommendations  None recommended by PT    Recommendations for Other Services OT consult     Precautions / Restrictions Precautions Precautions: Knee;Fall Restrictions Weight Bearing Restrictions: No Other Position/Activity Restrictions: WBAT    Mobility  Bed Mobility Overal bed mobility: Needs Assistance Bed Mobility: Supine to Sit     Supine to sit: Supervision     General bed mobility comments: Increased time  Transfers Overall transfer level: Needs assistance Equipment used: Rolling walker (2 wheeled) Transfers: Sit to/from Stand Sit to Stand: Supervision            Ambulation/Gait Ambulation/Gait assistance: Min guard;Supervision Ambulation Distance (Feet): 200 Feet Assistive device: Rolling walker (2 wheeled) Gait Pattern/deviations: Step-to pattern;Step-through pattern;Decreased step length - right;Decreased step length - left;Shuffle;Trunk flexed Gait velocity: decr Gait velocity interpretation: Below normal speed for age/gender General Gait Details: min cues for posture, position from RW and initial sequence   Stairs Stairs: Yes   Stair Management: No rails;Step to pattern;Backwards;With walker Number of Stairs: 2 General stair comments: cues for sequence and foot/RW placement  Wheelchair Mobility    Modified Rankin (Stroke Patients Only)       Balance                                             Cognition Arousal/Alertness: Awake/alert Behavior During Therapy: WFL for tasks assessed/performed Overall Cognitive Status: Within Functional Limits for tasks assessed                                        Exercises Total Joint Exercises Ankle Circles/Pumps: AROM;Both;15 reps;Supine Quad Sets: AROM;Both;Supine;15 reps Heel Slides: AAROM;Right;Supine;10 reps Hip ABduction/ADduction: AAROM;AROM;Right;10 reps;Supine Straight Leg Raises: Right;Supine;AAROM;AROM;10 reps Long Arc Quad: AROM;Right;10 reps;Supine Knee Flexion: AROM;Right;10 reps;Seated    General Comments        Pertinent Vitals/Pain Pain Assessment: 0-10 Pain Score: 5  Pain Location: L knee Pain Descriptors / Indicators: Aching;Sore Pain Intervention(s): Limited activity within patient's tolerance;Monitored during session;Premedicated before session;Ice applied    Home Living                      Prior Function            PT Goals (current goals can now be found in the care plan section) Acute Rehab PT Goals Patient Stated Goal: Regain IND PT Goal Formulation: With patient Time For Goal Achievement: 04/11/17 Potential to Achieve Goals: Good Progress towards PT goals: Progressing toward goals    Frequency    7X/week      PT Plan Current plan remains appropriate    Co-evaluation              AM-PAC PT "  6 Clicks" Daily Activity  Outcome Measure  Difficulty turning over in bed (including adjusting bedclothes, sheets and blankets)?: A Little Difficulty moving from lying on back to sitting on the side of the bed? : A Little Difficulty sitting down on and standing up from a chair with arms (e.g., wheelchair, bedside commode, etc,.)?: A Little Help needed moving to and from a bed to chair (including a wheelchair)?: A Little Help needed walking in hospital room?: A Little Help needed climbing 3-5 steps with a railing? : A Little 6 Click Score: 18    End of Session  Equipment Utilized During Treatment: Gait belt Activity Tolerance: Patient tolerated treatment well Patient left: in chair;with call bell/phone within reach Nurse Communication: Mobility status PT Visit Diagnosis: Difficulty in walking, not elsewhere classified (R26.2)     Time: [id number] PT Time Calculation (min) (ACUTE ONLY): 45 min  Charges:  $Gait Training: 8-22 mins $Therapeutic Exercise: 8-22 mins                    G Codes:       Pg [phone]    Briarrose Shor 04/03/2017, 12:55 PM

## 2017-04-03 NOTE — Progress Notes (Signed)
[facility]  Arrangements made for Mrs. Ok AnisKingston as follows: [facility] will provide all IV ABX and IV supplies for pt at the home. Pt will go to [facility] for Labs on Monday and Thursday and weekly PICC care thru 05-14-17 as ordered.  Pt will have appt time between 8-9 AM each visit for proper lab draw timing. Freddie BreechMatt Babish, PAC aware of arrangements and signed Short Stay orders.  Orders have been faxed. Pt made aware of the above plan. Contact info at [facility]:  Eber JonesCarolyn-  Fax: [phone] [facility] will deliver IV ABX and IV Supplies to the home tonight for her first home dose at 8-9 PM. Pt will then start 9A/9P dose schedule on Saturday a.m.  In hospital teaching with Mrs. Ok AnisKingston and she did very well for independence at home with IVABX.  If patient discharges after hours, please call [phone].   Sedalia Mutaamela S Chandler 04/03/2017, 9:27 AM

## 2017-04-03 NOTE — Progress Notes (Signed)
RN reviewed discharge instructions with patient and family. All questions answered.   Paperwork and prescriptions given.   NT rolled patient down with all belongings to family car. 

## 2017-04-05 LAB — AEROBIC/ANAEROBIC CULTURE W GRAM STAIN (SURGICAL/DEEP WOUND): Culture: NO GROWTH

## 2017-04-05 LAB — AEROBIC/ANAEROBIC CULTURE (SURGICAL/DEEP WOUND)

## 2017-04-06 ENCOUNTER — Encounter (HOSPITAL_COMMUNITY)
Admit: 2017-04-06 | Discharge: 2017-04-06 | Disposition: A | Discharge status: Home / Self Care | Payer: Self-pay | Attending: Internal Medicine | Admitting: Internal Medicine

## 2017-04-06 ENCOUNTER — Encounter (HOSPITAL_COMMUNITY)
Admission: RE | Admit: 2017-04-06 | Discharge: 2017-04-06 | Disposition: A | Discharge status: Home / Self Care | Payer: Self-pay | Source: Ambulatory Visit | Attending: Internal Medicine | Admitting: Internal Medicine

## 2017-04-06 DIAGNOSIS — M009 Pyogenic arthritis, unspecified: Secondary | ICD-10-CM | POA: Insufficient documentation

## 2017-04-06 DIAGNOSIS — Y838 Other surgical procedures as the cause of abnormal reaction of the patient, or of later complication, without mention of misadventure at the time of the procedure: Secondary | ICD-10-CM | POA: Insufficient documentation

## 2017-04-06 DIAGNOSIS — Z96659 Presence of unspecified artificial knee joint: Secondary | ICD-10-CM | POA: Insufficient documentation

## 2017-04-06 DIAGNOSIS — T8453XA Infection and inflammatory reaction due to internal right knee prosthesis, initial encounter: Secondary | ICD-10-CM | POA: Insufficient documentation

## 2017-04-06 LAB — CBC WITH DIFFERENTIAL/PLATELET
BASOS ABS: 0 10*3/uL (ref 0.0–0.1)
BASOS PCT: 0 %
EOS ABS: 0 10*3/uL (ref 0.0–0.7)
EOS PCT: 0 %
HCT: 30.6 % — ABNORMAL LOW (ref 36.0–46.0)
Hemoglobin: 10 g/dL — ABNORMAL LOW (ref 12.0–15.0)
Lymphocytes Relative: 23 %
Lymphs Abs: 1.2 10*3/uL (ref 0.7–4.0)
MCH: 26.5 pg (ref 26.0–34.0)
MCHC: 32.7 g/dL (ref 30.0–36.0)
MCV: 81 fL (ref 78.0–100.0)
MONO ABS: 0.8 10*3/uL (ref 0.1–1.0)
Monocytes Relative: 16 %
Neutro Abs: 3.1 10*3/uL (ref 1.7–7.7)
Neutrophils Relative %: 61 %
PLATELETS: 387 10*3/uL (ref 150–400)
RBC: 3.78 MIL/uL — ABNORMAL LOW (ref 3.87–5.11)
RDW: 15.9 % — AB (ref 11.5–15.5)
WBC: 5.1 10*3/uL (ref 4.0–10.5)

## 2017-04-06 LAB — VANCOMYCIN, TROUGH: Vancomycin Tr: 11 ug/mL — ABNORMAL LOW (ref 15–20)

## 2017-04-06 LAB — BASIC METABOLIC PANEL
Anion gap: 12 (ref 5–15)
BUN: 12 mg/dL (ref 6–20)
CALCIUM: 9.3 mg/dL (ref 8.9–10.3)
CO2: 22 mmol/L (ref 22–32)
CREATININE: 0.44 mg/dL (ref 0.44–1.00)
Chloride: 101 mmol/L (ref 101–111)
GFR calc non Af Amer: >60 mL/min (ref >60)
GLUCOSE: 107 mg/dL — AB (ref 65–99)
Potassium: 3.8 mmol/L (ref 3.5–5.1)
Sodium: 135 mmol/L (ref 135–145)

## 2017-04-06 MED ORDER — SODIUM CHLORIDE 0.9% FLUSH
10.0000 mL | INTRAVENOUS | Status: AC | PRN
  Administered 2017-04-06: 10 mL
  Filled 2017-04-06: qty 10

## 2017-04-06 MED ORDER — HEPARIN SOD (PORK) LOCK FLUSH 100 UNIT/ML IV SOLN
250.0000 [IU] | INTRAVENOUS | Status: AC | PRN
  Administered 2017-04-06: 250 [IU]
  Filled 2017-04-06: qty 5

## 2017-04-06 NOTE — Discharge Summary (Signed)
Physician Discharge Summary  Patient ID: Kathy Jennings MRN: [id number] DOB/AGE: 1951/02/23 66 y.o.  Admit date: 03/31/2017 Discharge date: 04/03/2017   Procedures:  Procedure(s) (LRB): RIGHT KNEE IRRIGATION AND DEBRIDEMENT WITH POLY EXCHANGE (Right)  Attending Physician:  Dr. Paralee Cancel   Admission Diagnoses:    Infected right TKA  Discharge Diagnoses:  Active Problems:   Infected prosthetic knee joint [facility])  Past Medical History:  Diagnosis Date  . Arthritis   . Complication of anesthesia    slow to wake up  . GERD (gastroesophageal reflux disease)   . OAB (overactive bladder)   . Peripheral vascular disease ([place])   . PONV (postoperative nausea and vomiting)   . Urgency of urination   . Varicose veins     HPI:    Pt is a 66 y.o. female complaining of right knee pain and drainage since February 08, 2017.  Her right TKA was performed on 10/31/2014 by Dr. Alvan Dame.  Pain and drainage have gradually increased.  X-rays in the clinic show previous TKA of the right knee. Pt has tried various conservative treatments which have failed to alleviate their symptoms. Various  options are discussed with the patient. Risks, benefits and expectations were discussed with the patient. Patient understand the risks, benefits and expectations and wishes to proceed with surgery.   PCP: Manon Hilding, MD   Discharged Condition: good  Hospital Course:  Patient underwent the above stated procedure on 03/31/2017. Patient tolerated the procedure well and brought to the recovery room in good condition and subsequently to the floor.  POD #1 BP: 131/69 ; Pulse: 60 ; Temp: 97.8 F (36.6 C) ; Resp: 16 Patient reports pain as mild, pain controlled. No events throughout the night.  Discussed OR findings and plans moving forward. Dorsiflexion/Plantar flexion intact and incision: dressing C/D/I.  LABS  Basename    HGB     9.0  HCT     28.1   POD #2  BP: 171/76 ; Pulse: 67 ; Temp: 98.5 F (36.9 C) ;  Resp: 16 Patient reports pain as mild to moderate.  Doing fine with therapy.  Waiting for cultures to turn. ID consult pending.  PIC line in place.  Care management in place awaiting discharge. Neurovascular intact and incision: dressing C/D/I - will have nursing remove her dressing down to Aquacel. this am  LABS  Basename    HGB     8.3  HCT     25.1   POD #3  BP: 138/63 ; Pulse: 63 ; Temp: 98.3 F (36.8 C) ; Resp: 16 Patient reports pain as mild, pain controlled. No events throughout the night.  Feels that she is working well with PT.  All home arrangements are being set up.  Ready to be discharged home.  Dorsiflexion/plantar flexion intact, incision: dressing C/D/I, no cellulitis present and compartment soft.   LABS   No new labs   Discharge Exam: General appearance: alert, cooperative and no distress Extremities: Homans sign is negative, no sign of DVT  Disposition: Home with follow up in 2 weeks   Follow-up Information    Paralee Cancel, MD. Schedule an appointment as soon as possible for a visit in 2 week(s).   Specialty:  Orthopedic Surgery Contact information: [address] [place] [address] [phone]        Carlyle Basques, MD Follow up.   Specialty:  Infectious Diseases Why:  Clinic Follow Up Appt: in 4-6 wk with Dr. Baxter Flattery  Contact information: [address] [place] [address] [phone]           Discharge Instructions    Call MD / Call 911    Complete by:  As directed    If you experience chest pain or shortness of breath, CALL 911 and be transported to the hospital emergency room.  If you develope a fever above 101 F, pus (white drainage) or increased drainage or redness at the wound, or calf pain, call your surgeon's office.   Change dressing    Complete by:  As directed    Maintain surgical dressing until follow up in the clinic. If the edges start to pull up, may reinforce with tape. If the dressing is  no longer working, may remove and cover with gauze and tape, but must keep the area dry and clean.  Call with any questions or concerns.   Constipation Prevention    Complete by:  As directed    Drink plenty of fluids.  Prune juice may be helpful.  You may use a stool softener, such as Colace (over the counter) 100 mg twice a day.  Use MiraLax (over the counter) for constipation as needed.   Diet - low sodium heart healthy    Complete by:  As directed    Discharge instructions    Complete by:  As directed    Maintain surgical dressing until follow up in the clinic. If the edges start to pull up, may reinforce with tape. If the dressing is no longer working, may remove and cover with gauze and tape, but must keep the area dry and clean.  Follow up in 2 weeks at [facility]. Call with any questions or concerns.   Home infusion instructions [facility] May follow [place] Dosing Protocol; May administer Cathflo as needed to maintain patency of vascular access device.; Flushing of vascular access device: per [facility] Protocol: 0.9% NaCl pre/post medica...    Complete by:  As directed    Instructions:  May follow [place] Dosing Protocol   Instructions:  May administer Cathflo as needed to maintain patency of vascular access device.   Instructions:  Flushing of vascular access device: per [facility] Protocol: 0.9% NaCl pre/post medication administration and prn patency; Heparin 100 u/ml, 79m for implanted ports and Heparin 10u/ml, 562mfor all other central venous catheters.   Instructions:  May follow [facility] Anaphylaxis Protocol for First Dose Administration in the home: 0.9% NaCl at 25-50 ml/hr to maintain IV access for protocol meds. Epinephrine 0.3 ml IV/IM PRN and Benadryl 25-50 IV/IM PRN s/s of anaphylaxis.   Instructions:  [facility] Coordinator (RN) to assist per patient IV care needs in the home PRN.   Increase activity slowly as tolerated    Complete by:  As  directed    Weight bearing as tolerated with assist device (walker, cane, etc) as directed, use it as long as suggested by your surgeon or therapist, typically at least 4-6 weeks.   TED hose    Complete by:  As directed    Use stockings (TED hose) for 2 weeks on both leg(s).  You may remove them at night for sleeping.      Allergies as of 04/03/2017      Reactions   Sulfa Antibiotics Nausea And Vomiting      Medication List    STOP taking these medications   acetaminophen 500 MG tablet Commonly known as:  TYLENOL  aspirin EC 81 MG tablet Replaced by:  aspirin 81 MG chewable tablet   cephALEXin 500 MG capsule Commonly known as:  KEFLEX     TAKE these medications   aspirin 81 MG chewable tablet Chew 1 tablet (81 mg total) by mouth 2 (two) times daily. Take for 4 weeks, then resume regular dose. Replaces:  aspirin EC 81 MG tablet   ciprofloxacin 500 MG tablet Commonly known as:  CIPRO Take 1 tablet (500 mg total) by mouth 2 (two) times daily.   docusate sodium 100 MG capsule Commonly known as:  COLACE Take 1 capsule (100 mg total) by mouth 2 (two) times daily.   Fish Oil 1200 MG Caps Take 2,400 mg by mouth daily with lunch.   HYDROcodone-acetaminophen 7.5-325 MG tablet Commonly known as:  NORCO Take 1-2 tablets by mouth every 4 (four) hours as needed for moderate pain.   loratadine 10 MG tablet Commonly known as:  CLARITIN Take 10 mg by mouth daily with lunch.   methocarbamol 500 MG tablet Commonly known as:  ROBAXIN Take 1 tablet (500 mg total) by mouth every 6 (six) hours as needed for muscle spasms.   multivitamin with minerals Tabs tablet Take 1 tablet by mouth daily with lunch.   omeprazole 20 MG capsule Commonly known as:  PRILOSEC Take 20 mg by mouth daily with lunch.   polyethylene glycol packet Commonly known as:  MIRALAX / GLYCOLAX Take 17 g by mouth 2 (two) times daily.   rifampin 300 MG capsule Commonly known as:  RIFADIN Take 1 capsule (300  mg total) by mouth every 12 (twelve) hours.   sodium chloride 0.65 % Soln nasal spray Commonly known as:  OCEAN Place 1 spray into both nostrils as needed for congestion.   vancomycin IVPB Inject 1,500 mg into the vein every 12 (twelve) hours. Indication:  Prosthetic joint infection Last Day of Therapy:  05/14/17 Labs - 'Sunday/Monday:  CBC/D, BMP, and vancomycin trough. Labs - Thursday:  BMP and vancomycin trough Labs - Every other week:  ESR and CRP   Vitamin D3 2000 units capsule Take 2,000 Units by mouth daily with lunch.            Home Infusion Instuctions        Start     Ordered   04/03/17 0000  Home infusion instructions [facility] May follow [facility] Dosing Protocol; May administer Cathflo as needed to maintain patency of vascular access device.; Flushing of vascular access device: per [facility] Protocol: 0.9% NaCl pre/post medica...    Question Answer Comment  Instructions May follow [facility] Dosing Protocol   Instructions May administer Cathflo as needed to maintain patency of vascular access device.   Instructions Flushing of vascular access device: per [facility] Protocol: 0.9% NaCl pre/post medication administration and prn patency; Heparin 100 u/ml, 5ml for implanted ports and Heparin 10u/ml, 5ml for all other central venous catheters.   Instructions May follow [facility] Anaphylaxis Protocol for First Dose Administration in the home: 0.9% NaCl at 25-50 ml/hr to maintain IV access for protocol meds. Epinephrine 0.3 ml IV/IM PRN and Benadryl 25-50 IV/IM PRN s/s of anaphylaxis.   Instructions [facility] Infusion Coordinator (RN) to assist per patient IV care needs in the home PRN.      08'$ /03/18 0800       Signed: West Pugh. Oden Lindaman   PA-C  04/06/2017, 12:19 PM

## 2017-04-09 ENCOUNTER — Encounter (HOSPITAL_COMMUNITY)
Admit: 2017-04-09 | Discharge: 2017-04-09 | Disposition: A | Discharge status: Home / Self Care | Payer: Self-pay | Attending: Internal Medicine | Admitting: Internal Medicine

## 2017-04-09 ENCOUNTER — Encounter (HOSPITAL_COMMUNITY): Payer: Self-pay

## 2017-04-09 DIAGNOSIS — Z5181 Encounter for therapeutic drug level monitoring: Secondary | ICD-10-CM

## 2017-04-09 LAB — BASIC METABOLIC PANEL
ANION GAP: 9 (ref 5–15)
BUN: 9 mg/dL (ref 6–20)
CO2: 25 mmol/L (ref 22–32)
Calcium: 9.2 mg/dL (ref 8.9–10.3)
Chloride: 101 mmol/L (ref 101–111)
Creatinine, Ser: 0.46 mg/dL (ref 0.44–1.00)
GLUCOSE: 107 mg/dL — AB (ref 65–99)
POTASSIUM: 4 mmol/L (ref 3.5–5.1)
Sodium: 135 mmol/L (ref 135–145)

## 2017-04-09 LAB — VANCOMYCIN, TROUGH: VANCOMYCIN TR: 12 ug/mL — AB (ref 15–20)

## 2017-04-09 MED ORDER — SODIUM CHLORIDE 0.9% FLUSH
INTRAVENOUS | Status: AC
  Filled 2017-04-09: qty 10

## 2017-04-09 MED ORDER — SODIUM CHLORIDE 0.9% FLUSH
10.0000 mL | INTRAVENOUS | Status: AC | PRN
  Administered 2017-04-09: 10 mL

## 2017-04-09 MED ORDER — HEPARIN SOD (PORK) LOCK FLUSH 100 UNIT/ML IV SOLN
250.0000 [IU] | INTRAVENOUS | Status: AC | PRN
  Administered 2017-04-09: 250 [IU]

## 2017-04-09 MED ORDER — HEPARIN SOD (PORK) LOCK FLUSH 100 UNIT/ML IV SOLN
INTRAVENOUS | Status: AC
  Filled 2017-04-09: qty 5

## 2017-04-13 ENCOUNTER — Encounter (HOSPITAL_COMMUNITY)
Admit: 2017-04-13 | Discharge: 2017-04-13 | Disposition: A | Discharge status: Home / Self Care | Payer: Self-pay | Attending: Internal Medicine | Admitting: Internal Medicine

## 2017-04-13 LAB — BASIC METABOLIC PANEL
Anion gap: 11 (ref 5–15)
BUN: 9 mg/dL (ref 6–20)
CALCIUM: 9.1 mg/dL (ref 8.9–10.3)
CO2: 25 mmol/L (ref 22–32)
CREATININE: 0.53 mg/dL (ref 0.44–1.00)
Chloride: 101 mmol/L (ref 101–111)
Glucose, Bld: 108 mg/dL — ABNORMAL HIGH (ref 65–99)
Potassium: 3.5 mmol/L (ref 3.5–5.1)
Sodium: 137 mmol/L (ref 135–145)

## 2017-04-13 LAB — CBC WITH DIFFERENTIAL/PLATELET
BASOS PCT: 1 %
Basophils Absolute: 0 10*3/uL (ref 0.0–0.1)
EOS ABS: 0 10*3/uL (ref 0.0–0.7)
Eosinophils Relative: 0 %
HCT: 29.9 % — ABNORMAL LOW (ref 36.0–46.0)
Hemoglobin: 9.6 g/dL — ABNORMAL LOW (ref 12.0–15.0)
Lymphocytes Relative: 26 %
Lymphs Abs: 1 10*3/uL (ref 0.7–4.0)
MCH: 26.2 pg (ref 26.0–34.0)
MCHC: 32.1 g/dL (ref 30.0–36.0)
MCV: 81.5 fL (ref 78.0–100.0)
MONO ABS: 0.7 10*3/uL (ref 0.1–1.0)
MONOS PCT: 18 %
NEUTROS PCT: 55 %
Neutro Abs: 2.2 10*3/uL (ref 1.7–7.7)
Platelets: 400 10*3/uL (ref 150–400)
RBC: 3.67 MIL/uL — ABNORMAL LOW (ref 3.87–5.11)
RDW: 16.5 % — AB (ref 11.5–15.5)
WBC: 4 10*3/uL (ref 4.0–10.5)

## 2017-04-13 LAB — C-REACTIVE PROTEIN: CRP: 4.6 mg/dL — AB (ref <1.0)

## 2017-04-13 LAB — SEDIMENTATION RATE: SED RATE: 90 mm/h — AB (ref 0–22)

## 2017-04-13 LAB — VANCOMYCIN, TROUGH: VANCOMYCIN TR: 18 ug/mL (ref 15–20)

## 2017-04-13 MED ORDER — HEPARIN SOD (PORK) LOCK FLUSH 100 UNIT/ML IV SOLN
INTRAVENOUS | Status: AC
Start: 2017-04-13 — End: 2017-04-13
  Filled 2017-04-13: qty 5

## 2017-04-13 MED ORDER — SODIUM CHLORIDE 0.9% FLUSH
INTRAVENOUS | Status: AC
Start: 2017-04-13 — End: 2017-04-13
  Filled 2017-04-13: qty 10

## 2017-04-14 NOTE — Progress Notes (Signed)
Results for [name], Kathy A (MRN [id number]) as of 04/14/2017 10:15  Ref. Range 04/13/2017 08:27  BASIC METABOLIC PANEL Unknown Rpt (A)  Sodium Latest Ref Range: 135 - 145 mmol/L 137  Potassium Latest Ref Range: 3.5 - 5.1 mmol/L 3.5  Chloride Latest Ref Range: 101 - 111 mmol/L 101  CO2 Latest Ref Range: 22 - 32 mmol/L 25  Glucose Latest Ref Range: 65 - 99 mg/dL 478108 (H)  BUN Latest Ref Range: 6 - 20 mg/dL 9  Creatinine Latest Ref Range: 0.44 - 1.00 mg/dL 2.950.53  Calcium Latest Ref Range: 8.9 - 10.3 mg/dL 9.1  Anion gap Latest Ref Range: 5 - 15  11  GFR, Est African American Latest Ref Range: >60 mL/min >60  GFR, Est Non African American Latest Ref Range: >60 mL/min >60  CRP Latest Ref Range: <1.0 mg/dL 4.6 (H)  WBC Latest Ref Range: 4.0 - 10.5 K/uL 4.0  RBC Latest Ref Range: 3.87 - 5.11 MIL/uL 3.67 (L)  Hemoglobin Latest Ref Range: 12.0 - 15.0 g/dL 9.6 (L)  HCT Latest Ref Range: 36.0 - 46.0 % 29.9 (L)  MCV Latest Ref Range: 78.0 - 100.0 fL 81.5  MCH Latest Ref Range: 26.0 - 34.0 pg 26.2  MCHC Latest Ref Range: 30.0 - 36.0 g/dL 62.132.1  RDW Latest Ref Range: 11.5 - 15.5 % 16.5 (H)  Platelets Latest Ref Range: 150 - 400 K/uL 400  Neutrophils Latest Units: % 55  Lymphocytes Latest Units: % 26  Monocytes Relative Latest Units: % 18  Eosinophil Latest Units: % 0  Basophil Latest Units: % 1  NEUT# Latest Ref Range: 1.7 - 7.7 K/uL 2.2  Lymphocyte # Latest Ref Range: 0.7 - 4.0 K/uL 1.0  Monocyte # Latest Ref Range: 0.1 - 1.0 K/uL 0.7  Eosinophils Absolute Latest Ref Range: 0.0 - 0.7 K/uL 0.0  Basophils Absolute Latest Ref Range: 0.0 - 0.1 K/uL 0.0  Sed Rate Latest Ref Range: 0 - 22 mm/hr 90 (H)  Vancomycin Tr Latest Ref Range: 15 - 20 ug/mL 18

## 2017-04-16 ENCOUNTER — Other Ambulatory Visit: Payer: Self-pay | Admitting: Pharmacist

## 2017-04-16 ENCOUNTER — Encounter (HOSPITAL_COMMUNITY)
Admit: 2017-04-16 | Discharge: 2017-04-16 | Disposition: A | Discharge status: Home / Self Care | Payer: Self-pay | Attending: Internal Medicine | Admitting: Internal Medicine

## 2017-04-16 LAB — BASIC METABOLIC PANEL
ANION GAP: 10 (ref 5–15)
BUN: 9 mg/dL (ref 6–20)
CALCIUM: 9.2 mg/dL (ref 8.9–10.3)
CO2: 26 mmol/L (ref 22–32)
CREATININE: 0.52 mg/dL (ref 0.44–1.00)
Chloride: 104 mmol/L (ref 101–111)
GLUCOSE: 116 mg/dL — AB (ref 65–99)
Potassium: 3.9 mmol/L (ref 3.5–5.1)
Sodium: 140 mmol/L (ref 135–145)

## 2017-04-16 LAB — VANCOMYCIN, TROUGH: VANCOMYCIN TR: 16 ug/mL (ref 15–20)

## 2017-04-16 MED ORDER — HEPARIN SOD (PORK) LOCK FLUSH 100 UNIT/ML IV SOLN
250.0000 [IU] | INTRAVENOUS | Status: AC | PRN
  Administered 2017-04-16: 250 [IU]

## 2017-04-16 MED ORDER — HEPARIN SOD (PORK) LOCK FLUSH 100 UNIT/ML IV SOLN
INTRAVENOUS | Status: AC
  Filled 2017-04-16: qty 5

## 2017-04-16 MED ORDER — SODIUM CHLORIDE 0.9% FLUSH
INTRAVENOUS | Status: AC
  Filled 2017-04-16: qty 10

## 2017-04-16 MED ORDER — SODIUM CHLORIDE 0.9% FLUSH
10.0000 mL | INTRAVENOUS | Status: AC | PRN
  Administered 2017-04-16: 10 mL

## 2017-04-17 NOTE — Progress Notes (Signed)
Results for ROTEM, LIJEWSKI (MRN [id number]) as of 04/17/2017 14:10  Ref. Range 04/16/2017 07:39  BASIC METABOLIC PANEL Unknown Rpt (A)  Sodium Latest Ref Range: 135 - 145 mmol/L 140  Potassium Latest Ref Range: 3.5 - 5.1 mmol/L 3.9  Chloride Latest Ref Range: 101 - 111 mmol/L 104  CO2 Latest Ref Range: 22 - 32 mmol/L 26  Glucose Latest Ref Range: 65 - 99 mg/dL 518 (H)  BUN Latest Ref Range: 6 - 20 mg/dL 9  Creatinine Latest Ref Range: 0.44 - 1.00 mg/dL 8.41  Calcium Latest Ref Range: 8.9 - 10.3 mg/dL 9.2  Anion gap Latest Ref Range: 5 - 15  10  GFR, Est African American Latest Ref Range: >60 mL/min >60  GFR, Est Non African American Latest Ref Range: >60 mL/min >60  Vancomycin Tr Latest Ref Range: 15 - 20 ug/mL 16

## 2017-04-20 ENCOUNTER — Encounter (HOSPITAL_COMMUNITY)
Admit: 2017-04-20 | Discharge: 2017-04-20 | Disposition: A | Discharge status: Home / Self Care | Payer: Self-pay | Attending: Internal Medicine | Admitting: Internal Medicine

## 2017-04-20 LAB — CBC WITH DIFFERENTIAL/PLATELET
Basophils Absolute: 0 10*3/uL (ref 0.0–0.1)
Basophils Relative: 2 %
EOS PCT: 0 %
Eosinophils Absolute: 0 10*3/uL (ref 0.0–0.7)
HCT: 33.5 % — ABNORMAL LOW (ref 36.0–46.0)
Hemoglobin: 10.6 g/dL — ABNORMAL LOW (ref 12.0–15.0)
LYMPHS PCT: 33 %
Lymphs Abs: 0.8 10*3/uL (ref 0.7–4.0)
MCH: 25.6 pg — AB (ref 26.0–34.0)
MCHC: 31.6 g/dL (ref 30.0–36.0)
MCV: 80.9 fL (ref 78.0–100.0)
MONOS PCT: 25 %
Monocytes Absolute: 0.6 10*3/uL (ref 0.1–1.0)
NEUTROS ABS: 0.9 10*3/uL — AB (ref 1.7–7.7)
Neutrophils Relative %: 41 %
PLATELETS: 287 10*3/uL (ref 150–400)
RBC: 4.14 MIL/uL (ref 3.87–5.11)
RDW: 16.5 % — ABNORMAL HIGH (ref 11.5–15.5)
WBC: 2.3 10*3/uL — ABNORMAL LOW (ref 4.0–10.5)

## 2017-04-20 LAB — BASIC METABOLIC PANEL
Anion gap: 11 (ref 5–15)
BUN: 10 mg/dL (ref 6–20)
CHLORIDE: 102 mmol/L (ref 101–111)
CO2: 24 mmol/L (ref 22–32)
CREATININE: 0.5 mg/dL (ref 0.44–1.00)
Calcium: 9.2 mg/dL (ref 8.9–10.3)
GFR calc Af Amer: >60 mL/min (ref >60)
Glucose, Bld: 107 mg/dL — ABNORMAL HIGH (ref 65–99)
Potassium: 3.6 mmol/L (ref 3.5–5.1)
SODIUM: 137 mmol/L (ref 135–145)

## 2017-04-20 LAB — VANCOMYCIN, TROUGH: Vancomycin Tr: 18 ug/mL (ref 15–20)

## 2017-04-20 MED ORDER — HEPARIN SOD (PORK) LOCK FLUSH 100 UNIT/ML IV SOLN
250.0000 [IU] | INTRAVENOUS | Status: AC | PRN
  Administered 2017-04-20: 250 [IU]
  Filled 2017-04-20 (×2): qty 5

## 2017-04-20 MED ORDER — SODIUM CHLORIDE 0.9% FLUSH
10.0000 mL | INTRAVENOUS | Status: AC | PRN
  Administered 2017-04-20: 10 mL
  Filled 2017-04-20: qty 10

## 2017-04-22 ENCOUNTER — Other Ambulatory Visit: Payer: Self-pay | Admitting: Pharmacist

## 2017-04-23 ENCOUNTER — Encounter (HOSPITAL_COMMUNITY)
Admit: 2017-04-23 | Discharge: 2017-04-23 | Disposition: A | Discharge status: Home / Self Care | Payer: Self-pay | Attending: Internal Medicine | Admitting: Internal Medicine

## 2017-04-23 LAB — BASIC METABOLIC PANEL
Anion gap: 8 (ref 5–15)
BUN: 10 mg/dL (ref 6–20)
CHLORIDE: 103 mmol/L (ref 101–111)
CO2: 25 mmol/L (ref 22–32)
CREATININE: 0.49 mg/dL (ref 0.44–1.00)
Calcium: 9 mg/dL (ref 8.9–10.3)
GFR calc Af Amer: >60 mL/min (ref >60)
GLUCOSE: 107 mg/dL — AB (ref 65–99)
POTASSIUM: 3.3 mmol/L — AB (ref 3.5–5.1)
Sodium: 136 mmol/L (ref 135–145)

## 2017-04-23 LAB — VANCOMYCIN, TROUGH: VANCOMYCIN TR: 15 ug/mL (ref 15–20)

## 2017-04-23 MED ORDER — HEPARIN SOD (PORK) LOCK FLUSH 100 UNIT/ML IV SOLN
250.0000 [IU] | INTRAVENOUS | Status: AC | PRN
  Administered 2017-04-23: 250 [IU]
  Filled 2017-04-23: qty 5

## 2017-04-23 MED ORDER — SODIUM CHLORIDE 0.9% FLUSH
10.0000 mL | INTRAVENOUS | Status: AC | PRN
  Administered 2017-04-23: 10 mL
  Filled 2017-04-23: qty 10

## 2017-04-27 ENCOUNTER — Encounter (HOSPITAL_COMMUNITY)
Admit: 2017-04-27 | Discharge: 2017-04-27 | Disposition: A | Discharge status: Home / Self Care | Payer: Self-pay | Attending: Internal Medicine | Admitting: Internal Medicine

## 2017-04-27 LAB — CBC WITH DIFFERENTIAL/PLATELET
Basophils Absolute: 0 10*3/uL (ref 0.0–0.1)
Basophils Relative: 1 %
EOS ABS: 0 10*3/uL (ref 0.0–0.7)
EOS PCT: 0 %
HCT: 31.6 % — ABNORMAL LOW (ref 36.0–46.0)
Hemoglobin: 10.2 g/dL — ABNORMAL LOW (ref 12.0–15.0)
LYMPHS ABS: 0.8 10*3/uL (ref 0.7–4.0)
LYMPHS PCT: 40 %
MCH: 26.1 pg (ref 26.0–34.0)
MCHC: 32.3 g/dL (ref 30.0–36.0)
MCV: 80.8 fL (ref 78.0–100.0)
MONO ABS: 0.6 10*3/uL (ref 0.1–1.0)
MONOS PCT: 28 %
Neutro Abs: 0.6 10*3/uL — ABNORMAL LOW (ref 1.7–7.7)
Neutrophils Relative %: 31 %
PLATELETS: 269 10*3/uL (ref 150–400)
RBC: 3.91 MIL/uL (ref 3.87–5.11)
RDW: 16.4 % — AB (ref 11.5–15.5)
WBC: 2 10*3/uL — AB (ref 4.0–10.5)

## 2017-04-27 LAB — BASIC METABOLIC PANEL
Anion gap: 10 (ref 5–15)
BUN: 10 mg/dL (ref 6–20)
CO2: 25 mmol/L (ref 22–32)
CREATININE: 0.45 mg/dL (ref 0.44–1.00)
Calcium: 9.3 mg/dL (ref 8.9–10.3)
Chloride: 101 mmol/L (ref 101–111)
GFR calc Af Amer: >60 mL/min (ref >60)
GLUCOSE: 106 mg/dL — AB (ref 65–99)
POTASSIUM: 3.5 mmol/L (ref 3.5–5.1)
SODIUM: 136 mmol/L (ref 135–145)

## 2017-04-27 LAB — SEDIMENTATION RATE: Sed Rate: 63 mm/hr — ABNORMAL HIGH (ref 0–22)

## 2017-04-27 LAB — C-REACTIVE PROTEIN: CRP: 2.6 mg/dL — AB (ref <1.0)

## 2017-04-27 LAB — VANCOMYCIN, TROUGH: Vancomycin Tr: 14 ug/mL — ABNORMAL LOW (ref 15–20)

## 2017-04-27 MED ORDER — SODIUM CHLORIDE 0.9% FLUSH
10.0000 mL | INTRAVENOUS | Status: AC | PRN
  Administered 2017-04-27: 10 mL
  Filled 2017-04-27: qty 10

## 2017-04-27 MED ORDER — HEPARIN SOD (PORK) LOCK FLUSH 100 UNIT/ML IV SOLN
INTRAVENOUS | Status: AC
  Filled 2017-04-27: qty 5

## 2017-04-27 MED ORDER — HEPARIN SOD (PORK) LOCK FLUSH 100 UNIT/ML IV SOLN
250.0000 [IU] | INTRAVENOUS | Status: AC | PRN
  Administered 2017-04-27: 250 [IU]
  Filled 2017-04-27: qty 5

## 2017-04-27 MED ORDER — SODIUM CHLORIDE 0.9% FLUSH
INTRAVENOUS | Status: AC
  Filled 2017-04-27: qty 10

## 2017-04-29 ENCOUNTER — Telehealth: Payer: Self-pay | Admitting: *Deleted

## 2017-04-29 ENCOUNTER — Telehealth: Payer: Self-pay | Admitting: Pharmacist

## 2017-04-29 ENCOUNTER — Other Ambulatory Visit: Payer: Self-pay | Admitting: Pharmacist

## 2017-04-29 NOTE — Telephone Encounter (Signed)
Lin at [facility] called to advise due to low white cell count he was told to change the patient to Anfec from Vanc. They do not do the first dose and this needs to be set up at [facility]. He was told to call the office to have this arranged. Esmond Plantsdvised Lin the patient has not been seen here and we do not have a signed order, dose or instructions. Advised this will take some time and someone will call him once it is done. He advised he will have to hold the patient medication until she gets the first dose. Advised sounds fair and will call him once this is arranged.

## 2017-04-29 NOTE — Telephone Encounter (Signed)
Per Larita FifeLynn, Pharmacist at [facility], patient is to stop IV Vancomycin per Dr. Feliz BeamSnider's order due to low wbc. Per verbal from Dr. Drue SecondSnider, will change patient's vancomycin to cefazolin 2 gm IV q8h. Patient pays out of pocket so he will see if she can afford. Patient also does not have nursing available at home, so she will have to be set up for first dose if needed.  I told Larita FifeLynn to contact our nurses regarding that. He verbalized understanding.

## 2017-04-29 NOTE — Telephone Encounter (Signed)
I will come in the morning to do paperwork

## 2017-04-30 ENCOUNTER — Other Ambulatory Visit: Payer: Self-pay | Admitting: Pharmacist

## 2017-05-01 ENCOUNTER — Encounter (HOSPITAL_COMMUNITY)
Admission: RE | Admit: 2017-05-01 | Discharge: 2017-05-01 | Disposition: A | Discharge status: Home / Self Care | Payer: Self-pay | Source: Ambulatory Visit | Attending: Internal Medicine | Admitting: Internal Medicine

## 2017-05-01 ENCOUNTER — Encounter (HOSPITAL_COMMUNITY)
Admit: 2017-05-01 | Discharge: 2017-05-01 | Disposition: A | Discharge status: Home / Self Care | Payer: Self-pay | Attending: Internal Medicine | Admitting: Internal Medicine

## 2017-05-01 LAB — BASIC METABOLIC PANEL
ANION GAP: 7 (ref 5–15)
BUN: 11 mg/dL (ref 6–20)
CALCIUM: 8.9 mg/dL (ref 8.9–10.3)
CO2: 26 mmol/L (ref 22–32)
CREATININE: 0.49 mg/dL (ref 0.44–1.00)
Chloride: 104 mmol/L (ref 101–111)
Glucose, Bld: 108 mg/dL — ABNORMAL HIGH (ref 65–99)
Potassium: 3.1 mmol/L — ABNORMAL LOW (ref 3.5–5.1)
Sodium: 137 mmol/L (ref 135–145)

## 2017-05-01 LAB — VANCOMYCIN, TROUGH

## 2017-05-01 MED ORDER — SODIUM CHLORIDE 0.9 % IV SOLN
Freq: Once | INTRAVENOUS | Status: AC
  Administered 2017-05-01: 250 mL via INTRAVENOUS

## 2017-05-01 MED ORDER — HEPARIN SOD (PORK) LOCK FLUSH 100 UNIT/ML IV SOLN
INTRAVENOUS | Status: AC
  Filled 2017-05-01: qty 5

## 2017-05-01 MED ORDER — HEPARIN SOD (PORK) LOCK FLUSH 100 UNIT/ML IV SOLN
250.0000 [IU] | Freq: Once | INTRAVENOUS | Status: AC
  Administered 2017-05-01: 250 [IU]

## 2017-05-01 MED ORDER — CEFAZOLIN SODIUM-DEXTROSE 2-4 GM/100ML-% IV SOLN
2.0000 g | Freq: Once | INTRAVENOUS | Status: AC
Start: 2017-05-01 — End: 2017-05-01
  Administered 2017-05-01: 2 g via INTRAVENOUS

## 2017-05-01 MED ORDER — SODIUM CHLORIDE 0.9% FLUSH
INTRAVENOUS | Status: AC
  Filled 2017-05-01: qty 10

## 2017-05-01 MED ORDER — CEFAZOLIN SODIUM-DEXTROSE 2-4 GM/100ML-% IV SOLN
INTRAVENOUS | Status: AC
  Filled 2017-05-01: qty 100

## 2017-05-01 MED ORDER — SODIUM CHLORIDE 0.9% FLUSH
10.0000 mL | Freq: Once | INTRAVENOUS | Status: AC
  Administered 2017-05-01: 10 mL

## 2017-05-01 NOTE — Progress Notes (Signed)
Awake. Voices no c/o at this time. Ice water given to drink. Resp adequate/nonlabored. No skin rash present. No drug reaction present at this time. D/C to home in good condition.

## 2017-05-01 NOTE — Progress Notes (Signed)
Results for Kathy Jennings, Kathy Jennings (MRN [id number]) as of 05/01/2017 10:19  Ref. Range 05/01/2017 07:57  BASIC METABOLIC PANEL Unknown Rpt (Jenn[place])  Sodium Latest Ref Range: 135 - 145 mmol/L 137  Potassium Latest Ref Range: 3.5 - 5.1 mmol/L 3.1 (L)  Chloride Latest Ref Range: 101 - 111 mmol/L 104  CO2 Latest Ref Range: 22 - 32 mmol/L 26  Glucose Latest Ref Range: 65 - 99 mg/dL 409108 (H)  BUN Latest Ref Range: 6 - 20 mg/dL 11  Creatinine Latest Ref Range: 0.44 - 1.00 mg/dL 8.110.49  Calcium Latest Ref Range: 8.9 - 10.3 mg/dL 8.9  Anion gap Latest Ref Range: 5 - 15  7  GFR, Est African American Latest Ref Range: >60 mL/min >60  GFR, Est Non African American Latest Ref Range: >60 mL/min >60  Vancomycin Tr Latest Ref Range: 15 - 20 ug/mL <4 (L)

## 2017-05-05 ENCOUNTER — Encounter (HOSPITAL_COMMUNITY)
Admit: 2017-05-05 | Discharge: 2017-05-05 | Disposition: A | Discharge status: Home / Self Care | Payer: Self-pay | Attending: Internal Medicine | Admitting: Internal Medicine

## 2017-05-05 ENCOUNTER — Encounter (HOSPITAL_COMMUNITY): Payer: Self-pay

## 2017-05-05 DIAGNOSIS — T8453XA Infection and inflammatory reaction due to internal right knee prosthesis, initial encounter: Secondary | ICD-10-CM | POA: Insufficient documentation

## 2017-05-05 LAB — BASIC METABOLIC PANEL
ANION GAP: 8 (ref 5–15)
BUN: 9 mg/dL (ref 6–20)
CHLORIDE: 102 mmol/L (ref 101–111)
CO2: 27 mmol/L (ref 22–32)
CREATININE: 0.51 mg/dL (ref 0.44–1.00)
Calcium: 9.2 mg/dL (ref 8.9–10.3)
GFR calc non Af Amer: >60 mL/min (ref >60)
GLUCOSE: 108 mg/dL — AB (ref 65–99)
Potassium: 3.5 mmol/L (ref 3.5–5.1)
Sodium: 137 mmol/L (ref 135–145)

## 2017-05-05 LAB — CBC WITH DIFFERENTIAL/PLATELET
BASOS PCT: 1 %
Basophils Absolute: 0 10*3/uL (ref 0.0–0.1)
Eosinophils Absolute: 0 10*3/uL (ref 0.0–0.7)
Eosinophils Relative: 0 %
HEMATOCRIT: 32.3 % — AB (ref 36.0–46.0)
HEMOGLOBIN: 10.4 g/dL — AB (ref 12.0–15.0)
LYMPHS PCT: 31 %
Lymphs Abs: 0.9 10*3/uL (ref 0.7–4.0)
MCH: 25.6 pg — ABNORMAL LOW (ref 26.0–34.0)
MCHC: 32.2 g/dL (ref 30.0–36.0)
MCV: 79.4 fL (ref 78.0–100.0)
MONOS PCT: 17 %
Monocytes Absolute: 0.5 10*3/uL (ref 0.1–1.0)
NEUTROS ABS: 1.4 10*3/uL — AB (ref 1.7–7.7)
NEUTROS PCT: 51 %
Platelets: 263 10*3/uL (ref 150–400)
RBC: 4.07 MIL/uL (ref 3.87–5.11)
RDW: 16 % — ABNORMAL HIGH (ref 11.5–15.5)
WBC: 2.8 10*3/uL — ABNORMAL LOW (ref 4.0–10.5)

## 2017-05-05 MED ORDER — SODIUM CHLORIDE 0.9% FLUSH
INTRAVENOUS | Status: AC
  Filled 2017-05-05: qty 10

## 2017-05-05 MED ORDER — SODIUM CHLORIDE 0.9% FLUSH
10.0000 mL | INTRAVENOUS | Status: AC | PRN
  Administered 2017-05-05: 10 mL
  Filled 2017-05-05: qty 10

## 2017-05-05 MED ORDER — HEPARIN SOD (PORK) LOCK FLUSH 100 UNIT/ML IV SOLN
250.0000 [IU] | INTRAVENOUS | Status: AC | PRN
  Administered 2017-05-05: 250 [IU]

## 2017-05-05 MED ORDER — HEPARIN SOD (PORK) LOCK FLUSH 100 UNIT/ML IV SOLN
INTRAVENOUS | Status: AC
  Filled 2017-05-05: qty 5

## 2017-05-05 NOTE — Progress Notes (Signed)
Results for Hughes BetterKINGSTON, Ilani A (MRN [id number]) as of 05/05/2017 10:05  Ref. Range 05/05/2017 08:20  BASIC METABOLIC PANEL Unknown Rpt (A)  Sodium Latest Ref Range: 135 - 145 mmol/L 137  Potassium Latest Ref Range: 3.5 - 5.1 mmol/L 3.5  Chloride Latest Ref Range: 101 - 111 mmol/L 102  CO2 Latest Ref Range: 22 - 32 mmol/L 27  Glucose Latest Ref Range: 65 - 99 mg/dL 376108 (H)  BUN Latest Ref Range: 6 - 20 mg/dL 9  Creatinine Latest Ref Range: 0.44 - 1.00 mg/dL 2.830.51  Calcium Latest Ref Range: 8.9 - 10.3 mg/dL 9.2  Anion gap Latest Ref Range: 5 - 15  8  GFR, Est Non African American Latest Ref Range: >60 mL/min >60  GFR, Est African American Latest Ref Range: >60 mL/min >60  WBC Latest Ref Range: 4.0 - 10.5 K/uL 2.8 (L)  RBC Latest Ref Range: 3.87 - 5.11 MIL/uL 4.07  Hemoglobin Latest Ref Range: 12.0 - 15.0 g/dL 15.110.4 (L)  HCT Latest Ref Range: 36.0 - 46.0 % 32.3 (L)  MCV Latest Ref Range: 78.0 - 100.0 fL 79.4  MCH Latest Ref Range: 26.0 - 34.0 pg 25.6 (L)  MCHC Latest Ref Range: 30.0 - 36.0 g/dL 76.132.2  RDW Latest Ref Range: 11.5 - 15.5 % 16.0 (H)  Platelets Latest Ref Range: 150 - 400 K/uL 263  Neutrophils Latest Units: % 51  Lymphocytes Latest Units: % 31  Monocytes Relative Latest Units: % 17  Eosinophil Latest Units: % 0  Basophil Latest Units: % 1  NEUT# Latest Ref Range: 1.7 - 7.7 K/uL 1.4 (L)  Lymphocyte # Latest Ref Range: 0.7 - 4.0 K/uL 0.9  Monocyte # Latest Ref Range: 0.1 - 1.0 K/uL 0.5  Eosinophils Absolute Latest Ref Range: 0.0 - 0.7 K/uL 0.0  Basophils Absolute Latest Ref Range: 0.0 - 0.1 K/uL 0.0

## 2017-05-06 ENCOUNTER — Encounter: Payer: Self-pay | Admitting: Internal Medicine

## 2017-05-06 ENCOUNTER — Ambulatory Visit (INDEPENDENT_AMBULATORY_CARE_PROVIDER_SITE_OTHER): Payer: Self-pay | Admitting: Internal Medicine

## 2017-05-06 VITALS — BP 155/79 | HR 65 | Temp 98.6°F | Wt 191.0 lb

## 2017-05-06 DIAGNOSIS — R600 Localized edema: Secondary | ICD-10-CM

## 2017-05-06 DIAGNOSIS — T8450XS Infection and inflammatory reaction due to unspecified internal joint prosthesis, sequela: Secondary | ICD-10-CM

## 2017-05-06 DIAGNOSIS — Z23 Encounter for immunization: Secondary | ICD-10-CM

## 2017-05-06 DIAGNOSIS — D702 Other drug-induced agranulocytosis: Secondary | ICD-10-CM

## 2017-05-06 DIAGNOSIS — I10 Essential (primary) hypertension: Secondary | ICD-10-CM

## 2017-05-06 MED ORDER — CEPHALEXIN 500 MG PO TABS: 500.0000 mg | ORAL_TABLET | Freq: Four times a day (QID) | ORAL | 4 refills | Status: DC

## 2017-05-06 MED ORDER — FLUCONAZOLE 150 MG PO TABS: 150.0000 mg | ORAL_TABLET | Freq: Every day | ORAL | 1 refills | Status: DC

## 2017-05-06 MED ORDER — RIFAMPIN 300 MG PO CAPS: 300.0000 mg | ORAL_CAPSULE | Freq: Two times a day (BID) | ORAL | 3 refills | Status: AC

## 2017-05-06 NOTE — Progress Notes (Signed)
Patient ID: Kathy Jennings, female   DOB: 04/26/1951, 66 y.o.   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 She was discharged on 6 wk of IV abtx of: - vancomycin per protocol (vanco trough of 15-20) plus oral cipro '500mg'$  BID  plus oral rifampin '300mg'$  BID with meals    She sustained leukopenia associated with vancomycin thus her abtx where changed to cefazolin, cipro, rif. She tolerates most of her medicines except for cipro causes upset stomach.  cx negative from time of surgery but prior to surgery she was placed on cephalexin- which she tolerated without difficulty  Outpatient Encounter Prescriptions as of 05/06/2017  Medication Sig  . ceFAZolin (ANCEF) IVPB Inject 2 g into the vein every 8 (eight) hours.  . Cholecalciferol (VITAMIN D3) 2000 units capsule Take 2,000 Units by mouth daily with lunch.  . ciprofloxacin (CIPRO) 500 MG tablet Take 1 tablet (500 mg total) by mouth 2 (two) times daily.  Marland Kitchen loratadine (CLARITIN) 10 MG tablet Take 10 mg by mouth daily with lunch.  . Multiple Vitamin (MULTIVITAMIN WITH MINERALS) TABS tablet Take 1 tablet by mouth daily with lunch.  . Omega-3 Fatty Acids (FISH OIL) 1200 MG CAPS Take 2,400 mg by mouth daily with lunch.  Marland Kitchen omeprazole (PRILOSEC) 20 MG capsule Take 20 mg by mouth daily with lunch.   . polyethylene glycol (MIRALAX / GLYCOLAX) packet Take 17 g by mouth 2 (two) times daily.  . rifampin (RIFADIN) 300 MG capsule Take 1 capsule (300 mg total) by mouth every 12 (twelve) hours.  . sodium chloride (OCEAN) 0.65 % SOLN nasal spray Place 1 spray into both nostrils as needed for congestion.  . vancomycin IVPB Inject 1,500 mg into the vein every 12 (twelve) hours. Indication:  Prosthetic joint infection Last Day of Therapy:  05/14/17 Labs - Sunday/Monday:  CBC/D, BMP, and vancomycin trough. Labs - Thursday:  BMP and vancomycin trough Labs - Every  other week:  ESR and CRP  . aspirin 81 MG chewable tablet Chew 1 tablet (81 mg total) by mouth 2 (two) times daily. Take for 4 weeks, then resume regular dose. (Patient not taking: Reported on 05/06/2017)  . docusate sodium (COLACE) 100 MG capsule Take 1 capsule (100 mg total) by mouth 2 (two) times daily. (Patient not taking: Reported on 05/06/2017)  . HYDROcodone-acetaminophen (NORCO) 7.5-325 MG tablet Take 1-2 tablets by mouth every 4 (four) hours as needed for moderate pain. (Patient not taking: Reported on 05/06/2017)  . methocarbamol (ROBAXIN) 500 MG tablet Take 1 tablet (500 mg total) by mouth every 6 (six) hours as needed for muscle spasms. (Patient not taking: Reported on 05/06/2017)   No facility-administered encounter medications on file as of 05/06/2017.      Patient Active Problem List   Diagnosis Date Noted  . Therapeutic drug monitoring   . 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 S/P right TKA 10/31/2014  . S/P knee replacement 10/31/2014  . Postoperative anemia due to acute blood loss 04/19/2014  . Obese 04/19/2014  . S/P right THA, AA 04/18/2014     Health Maintenance Due  Topic Date Due  . Hepatitis C Screening  26-Oct-1950  . HIV Screening  04/23/1968  . TETANUS/TDAP  04/23/1972  . PAP SMEAR  04/23/1974  . MAMMOGRAM  04/24/2003  . COLONOSCOPY  04/24/2003  . INFLUENZA VACCINE  04/01/2017     Review of Systems Review  of Systems  Constitutional: Negative for fever, chills, diaphoresis, activity change, appetite change, fatigue and unexpected weight change.  HENT: Negative for congestion, sore throat, rhinorrhea, sneezing, trouble swallowing and sinus pressure.  Eyes: Negative for photophobia and visual disturbance.  Respiratory: Negative for cough, chest tightness, shortness of breath, wheezing and stridor.  Cardiovascular: Negative for chest pain, palpitations and leg swelling.  Gastrointestinal: Negative for nausea, vomiting, abdominal pain, diarrhea,  constipation, blood in stool, abdominal distention and anal bleeding.  Genitourinary: Negative for dysuria, hematuria, flank pain and difficulty urinating.  Musculoskeletal: Negative for myalgias, back pain, joint swelling, arthralgias and gait problem.  Skin: Negative for color change, pallor, rash and wound.  Neurological: Negative for dizziness, tremors, weakness and light-headedness.  Hematological: Negative for adenopathy. Does not bruise/bleed easily.  Psychiatric/Behavioral: Negative for behavioral problems, confusion, sleep disturbance, dysphoric mood, decreased concentration and agitation.    Physical Exam   BP (!) 155/79   Pulse 65   Temp 98.6 F (37 C) (Oral)   Wt 191 lb (86.6 kg)   BMI 32.79 kg/m   Physical Exam  Constitutional:  oriented to person, place, and time. appears well-developed and well-nourished. No distress.  HENT: [place]/AT, PERRLA, no scleral icterus Mouth/Throat: Oropharynx is clear and moist. No oropharyngeal exudate.  Ext: pitting edema to right leg up to the knee Lymphadenopathy: no cervical adenopathy. No axillary adenopathy Neurological: alert and oriented to person, place, and time.  Skin: surgical incision is well healed Psychiatric: a normal mood and affect.  behavior is normal.   CBC Lab Results  Component Value Date   WBC 2.8 (L) 05/05/2017   RBC 4.07 05/05/2017   HGB 10.4 (L) 05/05/2017   HCT 32.3 (L) 05/05/2017   PLT 263 05/05/2017   MCV 79.4 05/05/2017   MCH 25.6 (L) 05/05/2017   MCHC 32.2 05/05/2017   RDW 16.0 (H) 05/05/2017   LYMPHSABS 0.9 05/05/2017   MONOABS 0.5 05/05/2017   EOSABS 0.0 05/05/2017    BMET Lab Results  Component Value Date   NA 137 05/05/2017   K 3.5 05/05/2017   CL 102 05/05/2017   CO2 27 05/05/2017   GLUCOSE 108 (H) 05/05/2017   BUN 9 05/05/2017   CREATININE 0.51 05/05/2017   CALCIUM 9.2 05/05/2017   GFRNONAA >60 05/05/2017   GFRAA >60 05/05/2017    Lab Results  Component Value Date   ESRSEDRATE 63  (H) 04/27/2017   Lab Results  Component Value Date   CRP 2.6 (H) 04/27/2017     Assessment and Plan 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 Wbc from 2 up to 2.8. BL 4.5. We will repeat CBC next week when she finishes her Iv abtx. Likely from vancomycin. Transient effect. Appears to be recovering  Essential hypertension = have her follow up with PCP to evaluate if need to change any of her regimen  Lower extremity edema = patient reports onset since taking IV abtx. Recommend raising leg when in chair and will follow up to see if still having signficant edema at next visit  rtc in 4-6 wk

## 2017-05-07 ENCOUNTER — Encounter (HOSPITAL_COMMUNITY)
Admit: 2017-05-07 | Discharge: 2017-05-07 | Disposition: A | Discharge status: Home / Self Care | Payer: Self-pay | Attending: Internal Medicine | Admitting: Internal Medicine

## 2017-05-07 LAB — BASIC METABOLIC PANEL
Anion gap: 7 (ref 5–15)
BUN: 9 mg/dL (ref 6–20)
CHLORIDE: 103 mmol/L (ref 101–111)
CO2: 27 mmol/L (ref 22–32)
CREATININE: 0.54 mg/dL (ref 0.44–1.00)
Calcium: 9.2 mg/dL (ref 8.9–10.3)
GFR calc Af Amer: >60 mL/min (ref >60)
GFR calc non Af Amer: >60 mL/min (ref >60)
GLUCOSE: 110 mg/dL — AB (ref 65–99)
Potassium: 3.4 mmol/L — ABNORMAL LOW (ref 3.5–5.1)
Sodium: 137 mmol/L (ref 135–145)

## 2017-05-07 MED ORDER — HEPARIN SOD (PORK) LOCK FLUSH 100 UNIT/ML IV SOLN
250.0000 [IU] | INTRAVENOUS | Status: AC | PRN
  Administered 2017-05-07: 250 [IU]

## 2017-05-07 MED ORDER — HEPARIN SOD (PORK) LOCK FLUSH 100 UNIT/ML IV SOLN
INTRAVENOUS | Status: AC
  Filled 2017-05-07: qty 5

## 2017-05-07 MED ORDER — SODIUM CHLORIDE 0.9% FLUSH
10.0000 mL | INTRAVENOUS | Status: AC | PRN
  Administered 2017-05-07: 10 mL

## 2017-05-07 MED ORDER — SODIUM CHLORIDE 0.9% FLUSH
INTRAVENOUS | Status: AC
  Filled 2017-05-07: qty 10

## 2017-05-07 NOTE — Progress Notes (Signed)
Results for Hughes BetterKINGSTON, Khari A (MRN [id number]) as of 05/07/2017 12:37  Ref. Range 05/07/2017 07:48  BASIC METABOLIC PANEL Unknown Rpt (A)  Sodium Latest Ref Range: 135 - 145 mmol/L 137  Potassium Latest Ref Range: 3.5 - 5.1 mmol/L 3.4 (L)  Chloride Latest Ref Range: 101 - 111 mmol/L 103  CO2 Latest Ref Range: 22 - 32 mmol/L 27  Glucose Latest Ref Range: 65 - 99 mg/dL 409110 (H)  BUN Latest Ref Range: 6 - 20 mg/dL 9  Creatinine Latest Ref Range: 0.44 - 1.00 mg/dL 8.110.54  Calcium Latest Ref Range: 8.9 - 10.3 mg/dL 9.2  Anion gap Latest Ref Range: 5 - 15  7  GFR, Est Non African American Latest Ref Range: >60 mL/min >60  GFR, Est African American Latest Ref Range: >60 mL/min >60

## 2017-05-11 ENCOUNTER — Telehealth: Payer: Self-pay | Admitting: *Deleted

## 2017-05-11 ENCOUNTER — Encounter (HOSPITAL_COMMUNITY)
Admit: 2017-05-11 | Discharge: 2017-05-11 | Disposition: A | Discharge status: Home / Self Care | Payer: Self-pay | Attending: Internal Medicine | Admitting: Internal Medicine

## 2017-05-11 LAB — CBC WITH DIFFERENTIAL/PLATELET
BASOS ABS: 0 10*3/uL (ref 0.0–0.1)
Basophils Relative: 1 %
EOS PCT: 0 %
Eosinophils Absolute: 0 10*3/uL (ref 0.0–0.7)
HEMATOCRIT: 32.1 % — AB (ref 36.0–46.0)
Hemoglobin: 10.3 g/dL — ABNORMAL LOW (ref 12.0–15.0)
LYMPHS ABS: 1 10*3/uL (ref 0.7–4.0)
LYMPHS PCT: 34 %
MCH: 25.6 pg — ABNORMAL LOW (ref 26.0–34.0)
MCHC: 32.1 g/dL (ref 30.0–36.0)
MCV: 79.7 fL (ref 78.0–100.0)
Monocytes Absolute: 0.5 10*3/uL (ref 0.1–1.0)
Monocytes Relative: 17 %
NEUTROS ABS: 1.4 10*3/uL — AB (ref 1.7–7.7)
Neutrophils Relative %: 48 %
PLATELETS: 250 10*3/uL (ref 150–400)
RBC: 4.03 MIL/uL (ref 3.87–5.11)
RDW: 16.1 % — ABNORMAL HIGH (ref 11.5–15.5)
WBC: 3 10*3/uL — AB (ref 4.0–10.5)

## 2017-05-11 LAB — BASIC METABOLIC PANEL
ANION GAP: 9 (ref 5–15)
BUN: 11 mg/dL (ref 6–20)
CO2: 26 mmol/L (ref 22–32)
Calcium: 9.1 mg/dL (ref 8.9–10.3)
Chloride: 103 mmol/L (ref 101–111)
Creatinine, Ser: 0.48 mg/dL (ref 0.44–1.00)
Glucose, Bld: 107 mg/dL — ABNORMAL HIGH (ref 65–99)
POTASSIUM: 3.5 mmol/L (ref 3.5–5.1)
SODIUM: 138 mmol/L (ref 135–145)

## 2017-05-11 MED ORDER — SODIUM CHLORIDE 0.9% FLUSH
10.0000 mL | INTRAVENOUS | Status: AC | PRN
  Administered 2017-05-11: 10 mL

## 2017-05-11 MED ORDER — HEPARIN SOD (PORK) LOCK FLUSH 100 UNIT/ML IV SOLN
INTRAVENOUS | Status: AC
  Filled 2017-05-11: qty 5

## 2017-05-11 MED ORDER — SODIUM CHLORIDE 0.9% FLUSH
INTRAVENOUS | Status: AC
  Filled 2017-05-11: qty 10

## 2017-05-11 MED ORDER — HEPARIN SOD (PORK) LOCK FLUSH 100 UNIT/ML IV SOLN
250.0000 [IU] | INTRAVENOUS | Status: AC | PRN
  Administered 2017-05-11: 250 [IU]

## 2017-05-11 NOTE — Telephone Encounter (Signed)
[facility]. [facility] was given written prescription to have PICC pulled at completion of IV antiobiotics, written date is 9/14. Per office note, patient completes IV antibiotics 9/13. She asked if her PICC could be pulled at same time, rather than her coming back the next day. Andree CossHowell, Lindy Garczynski M, RN

## 2017-05-11 NOTE — Progress Notes (Signed)
Talked with Marcelino [facility]usterMichelle at [facility] to clarify when to d/c PICC line. Order given to d/c PICC line on 05/14/17 and to d/c order for 05/15/17. Order in chart.

## 2017-05-14 ENCOUNTER — Encounter (HOSPITAL_COMMUNITY)
Admission: RE | Admit: 2017-05-14 | Discharge: 2017-05-14 | Disposition: A | Discharge status: Home / Self Care | Payer: Self-pay | Source: Ambulatory Visit | Attending: Internal Medicine | Admitting: Internal Medicine

## 2017-05-14 ENCOUNTER — Encounter (HOSPITAL_COMMUNITY)
Admit: 2017-05-14 | Discharge: 2017-05-14 | Disposition: A | Discharge status: Home / Self Care | Payer: Self-pay | Attending: Internal Medicine | Admitting: Internal Medicine

## 2017-05-14 LAB — BASIC METABOLIC PANEL
Anion gap: 7 (ref 5–15)
BUN: 12 mg/dL (ref 6–20)
CALCIUM: 8.9 mg/dL (ref 8.9–10.3)
CO2: 27 mmol/L (ref 22–32)
CREATININE: 0.43 mg/dL — AB (ref 0.44–1.00)
Chloride: 104 mmol/L (ref 101–111)
GFR calc Af Amer: >60 mL/min (ref >60)
GFR calc non Af Amer: >60 mL/min (ref >60)
GLUCOSE: 102 mg/dL — AB (ref 65–99)
Potassium: 3.3 mmol/L — ABNORMAL LOW (ref 3.5–5.1)
Sodium: 138 mmol/L (ref 135–145)

## 2017-05-14 LAB — CBC
HCT: 30.8 % — ABNORMAL LOW (ref 36.0–46.0)
Hemoglobin: 9.8 g/dL — ABNORMAL LOW (ref 12.0–15.0)
MCH: 25.4 pg — AB (ref 26.0–34.0)
MCHC: 31.8 g/dL (ref 30.0–36.0)
MCV: 79.8 fL (ref 78.0–100.0)
PLATELETS: 225 10*3/uL (ref 150–400)
RBC: 3.86 MIL/uL — AB (ref 3.87–5.11)
RDW: 16.6 % — ABNORMAL HIGH (ref 11.5–15.5)
WBC: 2.6 10*3/uL — AB (ref 4.0–10.5)

## 2017-05-14 MED ORDER — SODIUM CHLORIDE 0.9% FLUSH
INTRAVENOUS | Status: AC
  Filled 2017-05-14: qty 30

## 2017-05-14 MED ORDER — SODIUM CHLORIDE 0.9% FLUSH
INTRAVENOUS | Status: AC
  Filled 2017-05-14: qty 10

## 2017-05-14 NOTE — Progress Notes (Signed)
Results for Hughes BetterKINGSTON, Andee A (MRN [id number]) as of 05/14/2017 11:22  Ref. Range 05/14/2017 08:38  Sodium Latest Ref Range: 135 - 145 mmol/L 138  Potassium Latest Ref Range: 3.5 - 5.1 mmol/L 3.3 (L)  Chloride Latest Ref Range: 101 - 111 mmol/L 104  CO2 Latest Ref Range: 22 - 32 mmol/L 27  Glucose Latest Ref Range: 65 - 99 mg/dL 409102 (H)  BUN Latest Ref Range: 6 - 20 mg/dL 12  Creatinine Latest Ref Range: 0.44 - 1.00 mg/dL 8.110.43 (L)  Calcium Latest Ref Range: 8.9 - 10.3 mg/dL 8.9  Anion gap Latest Ref Range: 5 - 15  7  GFR, Est Non African American Latest Ref Range: >60 mL/min >60  GFR, Est African American Latest Ref Range: >60 mL/min >60  WBC Latest Ref Range: 4.0 - 10.5 K/uL 2.6 (L)  RBC Latest Ref Range: 3.87 - 5.11 MIL/uL 3.86 (L)  Hemoglobin Latest Ref Range: 12.0 - 15.0 g/dL 9.8 (L)  HCT Latest Ref Range: 36.0 - 46.0 % 30.8 (L)  MCV Latest Ref Range: 78.0 - 100.0 fL 79.8  MCH Latest Ref Range: 26.0 - 34.0 pg 25.4 (L)  MCHC Latest Ref Range: 30.0 - 36.0 g/dL 91.431.8  RDW Latest Ref Range: 11.5 - 15.5 % 16.6 (H)  Platelets    Latest Ref Range: 150 - 400 K/uL 225   PICC line removed without complication. Pt given after care instructions. Pt and husband verbalized understanding.

## 2017-05-14 NOTE — Discharge Instructions (Signed)
PICC Removal, Care After Refer to this sheet in the next few weeks. These instructions provide you with information on caring for yourself after your procedure. Your health care provider may also give you more specific instructions. Your treatment has been planned according to current medical practices, but problems sometimes occur. Call your health care provider if you have any problems or questions after your procedure. What can I expect after the procedure? After your procedure, it is typical to have mild discomfort at the insertion site. This should not last for more than a day. Follow these instructions at home: You may remove the bandage after 24 hours. The PICC insertion site is very small. A small scab may develop over the insertion site. It is okay to wash the site gently with soap and water. Be careful not to remove or pick off the scab. Gently pat the site dry after washing it. You do not need to put another bandage over the insertion site. Do not lift anything heavy or do strenuous physical activity for 24 hours after the PICC is removed. This includes:  Weight lifting.  Strenuous yard work.  Any physical activity with repetitive arm movement.  Contact a health care provider if:  You have swelling or puffiness in your arm at the PICC insertion site.  You have increasing tenderness at the PICC insertion site. Get help right away if:  You have numbness or tingling in your fingers, hand, or arm.  Your arm looks blue and feels cold.  You have redness around the insertion site or a red streak goes up your arm.  You have any type of drainage from the PICC insertion site. This includes drainage such as: ? Bleeding from the insertion site. If this happens, apply firm, direct pressure to the PICC insertion site with a clean towel. ? Drainage that is yellow or tan.  You have a fever. This information is not intended to replace advice given to you by your health care provider. Make  sure you discuss any questions you have with your health care provider. Document Released: 08/23/2013 Document Revised: 01/24/2016 Document Reviewed: 06/10/2013 Elsevier Interactive Patient Education  2017 [employer] Inc[employer]  

## 2017-05-19 NOTE — Progress Notes (Signed)
Results for Kathy Jennings, Kathy Jennings (MRN [id number]) as of 05/19/2017 10:55  Ref. Range 05/14/2017 08:38  BASIC METABOLIC PANEL Unknown Rpt (A)  Sodium Latest Ref Range: 135 - 145 mmol/L 138  Potassium Latest Ref Range: 3.5 - 5.1 mmol/L 3.3 (L)  Chloride Latest Ref Range: 101 - 111 mmol/L 104  CO2 Latest Ref Range: 22 - 32 mmol/L 27  Glucose Latest Ref Range: 65 - 99 mg/dL 409 (H)  BUN Latest Ref Range: 6 - 20 mg/dL 12  Creatinine Latest Ref Range: 0.44 - 1.00 mg/dL 8.11 (L)  Calcium Latest Ref Range: 8.9 - 10.3 mg/dL 8.9  Anion gap Latest Ref Range: 5 - 15  7  GFR, Est Non African American Latest Ref Range: >60 mL/min >60  GFR, Est African American Latest Ref Range: >60 mL/min >60  WBC Latest Ref Range: 4.0 - 10.5 K/uL 2.6 (L)  RBC Latest Ref Range: 3.87 - 5.11 MIL/uL 3.86 (L)  Hemoglobin Latest Ref Range: 12.0 - 15.0 g/dL 9.8 (L)  HCT Latest Ref Range: 36.0 - 46.0 % 30.8 (L)  MCV Latest Ref Range: 78.0 - 100.0 fL 79.8  MCH Latest Ref Range: 26.0 - 34.0 pg 25.4 (L)  MCHC Latest Ref Range: 30.0 - 36.0 g/dL 91.4  RDW Latest Ref Range: 11.5 - 15.5 % 16.6 (H)  Platelets Latest Ref Range: 150 - 400 K/uL 225

## 2017-06-10 ENCOUNTER — Encounter: Payer: Self-pay | Admitting: Internal Medicine

## 2017-06-10 ENCOUNTER — Ambulatory Visit (INDEPENDENT_AMBULATORY_CARE_PROVIDER_SITE_OTHER): Payer: Self-pay | Admitting: Internal Medicine

## 2017-06-10 VITALS — BP 137/82 | HR 73 | Temp 98.1°F | Ht 64.0 in | Wt 184.0 lb

## 2017-06-10 DIAGNOSIS — D702 Other drug-induced agranulocytosis: Secondary | ICD-10-CM

## 2017-06-10 DIAGNOSIS — T887XXA Unspecified adverse effect of drug or medicament, initial encounter: Secondary | ICD-10-CM

## 2017-06-10 DIAGNOSIS — T8450XD Infection and inflammatory reaction due to unspecified internal joint prosthesis, subsequent encounter: Secondary | ICD-10-CM

## 2017-06-10 NOTE — Progress Notes (Signed)
RFV: follow up for PJI  Patient ID: Kathy Jennings, female   DOB: 12/20/1950, 66 y.o.   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 She had sustained some leukopenia associated with vancomycin which had been stopped. Wbc nadir of 2.0 at end of august and at last visit, it increased to 2.8 at beginning of September. She has been taking her oral abtx of cephalexin plus rif with out difficulty  Applying for disability since unlikely to return to work. 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 aspirin 81 MG chewable tablet Chew 1 tablet (81 mg total) by mouth 2 (two) times daily. Take for 4 weeks, then resume regular dose.  Marland Kitchen ceFAZolin (ANCEF) IVPB Inject 2 g into the vein every 8 (eight) hours.  . Cephalexin 500 MG tablet Take 1 tablet (500 mg total) by mouth 4 (four) times daily.  . Cholecalciferol (VITAMIN D3) 2000 units capsule Take 2,000 Units by mouth daily with lunch.  . fluconazole (DIFLUCAN) 150 MG tablet Take 1 tablet (150 mg total) by mouth daily. Every 3 days if needed for yeast infection  . loratadine (CLARITIN) 10 MG tablet Take 10 mg by mouth daily with lunch.  . Multiple Vitamin (MULTIVITAMIN WITH MINERALS) TABS tablet Take 1 tablet by mouth daily with lunch.  . Omega-3 Fatty Acids (FISH OIL) 1200 MG CAPS Take 2,400 mg by mouth daily with lunch.  Marland Kitchen omeprazole (PRILOSEC) 20 MG capsule Take 20 mg by mouth daily with lunch.   . polyethylene glycol (MIRALAX / GLYCOLAX) packet Take 17 g by mouth 2 (two) times daily.  . sodium chloride (OCEAN) 0.65 % SOLN nasal  spray Place 1 spray into both nostrils as needed for congestion.  . vancomycin IVPB Inject 1,500 mg into the vein every 12 (twelve) hours. Indication:  Prosthetic joint infection Last Day of Therapy:  05/14/17 Labs - Sunday/Monday:  CBC/D, BMP, and vancomycin trough. Labs - Thursday:  BMP and vancomycin trough Labs - Every other week:  ESR and CRP   No facility-administered encounter medications on file as of 06/10/2017.      Patient Active Problem List   Diagnosis Date Noted  . Therapeutic drug monitoring   . 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 S/P right TKA 10/31/2014  . S/P knee replacement 10/31/2014  . Postoperative anemia due to acute blood loss 04/19/2014  . Obese 04/19/2014  . S/P right THA, AA 04/18/2014     Health Maintenance Due  Topic Date Due  . Hepatitis C Screening  31-Oct-1950  . HIV Screening  04/23/1968  . TETANUS/TDAP  04/23/1972  . PAP SMEAR  04/23/1974  . MAMMOGRAM  04/24/2003  . COLONOSCOPY  04/24/2003     Review of Systems  Physical Exam   BP 137/82   Pulse 73   Temp 98.1 F (36.7 C) (Oral)   Ht _0  (1.626 m)   Wt 184 lb (83.5 kg)   BMI 31.58 kg/m   Physical Exam  Constitutional:  oriented to person, place, and time. appears well-developed and well-nourished. No distress.  HENT: [place]/AT, PERRLA, no  scleral icterus Mouth/Throat: Oropharynx is clear and moist. No oropharyngeal exudate.  Ext = rght knee is well healed slightly swollen incomparison to left , well healed surgical incision but no warmth or erythema Lymphadenopathy: no cervical adenopathy. No axillary adenopathy Neurological: alert and oriented to person, place, and time.  Skin: Skin is warm and dry. No rash noted. No erythema.  Psychiatric: a normal mood and affect.  behavior is normal.    CBC Lab Results  Component Value Date   WBC 2.6 (L) 05/14/2017   RBC 3.86 (L) 05/14/2017   HGB 9.8 (L) 05/14/2017   HCT 30.8 (L) 05/14/2017   PLT 225 05/14/2017   MCV 79.8  05/14/2017   MCH 25.4 (L) 05/14/2017   MCHC 31.8 05/14/2017   RDW 16.6 (H) 05/14/2017   LYMPHSABS 1.0 05/11/2017   MONOABS 0.5 05/11/2017   EOSABS 0.0 05/11/2017    BMET Lab Results  Component Value Date   NA 138 05/14/2017   K 3.3 (L) 05/14/2017   CL 104 05/14/2017   CO2 27 05/14/2017   GLUCOSE 102 (H) 05/14/2017   BUN 12 05/14/2017   CREATININE 0.43 (L) 05/14/2017   CALCIUM 8.9 05/14/2017   GFRNONAA >60 05/14/2017   GFRAA >60 05/14/2017      Assessment and Plan  PJI =Continue with cephalexin plus rif for next 3.5 months Will check sed rate, crp and cbc  Leukopenia 2/2 vanco = Will check cbc to see if leukopenia resolve.  Drug side effect with rifampin/drug monitoring= Will check cmp since on rif

## 2017-06-11 LAB — COMPLETE METABOLIC PANEL WITH GFR
AG Ratio: 1.4 (calc) (ref 1.0–2.5)
ALBUMIN MSPROF: 4.1 g/dL (ref 3.6–5.1)
ALKALINE PHOSPHATASE (APISO): 92 U/L (ref 33–130)
ALT: 58 U/L — ABNORMAL HIGH (ref 6–29)
AST: 29 U/L (ref 10–35)
BILIRUBIN TOTAL: 0.4 mg/dL (ref 0.2–1.2)
BUN: 19 mg/dL (ref 7–25)
CHLORIDE: 103 mmol/L (ref 98–110)
CO2: 26 mmol/L (ref 20–32)
Calcium: 9.9 mg/dL (ref 8.6–10.4)
Creat: 0.83 mg/dL (ref 0.50–0.99)
GFR, EST AFRICAN AMERICAN: 86 mL/min/{1.73_m2} (ref >=60)
GFR, Est Non African American: 75 mL/min/{1.73_m2} (ref >=60)
GLOBULIN: 3 g/dL (ref 1.9–3.7)
GLUCOSE: 106 mg/dL — AB (ref 65–99)
Potassium: 4.1 mmol/L (ref 3.5–5.3)
SODIUM: 139 mmol/L (ref 135–146)
TOTAL PROTEIN: 7.1 g/dL (ref 6.1–8.1)

## 2017-06-11 LAB — CBC WITH DIFFERENTIAL/PLATELET
BASOS ABS: 31 {cells}/uL (ref 0–200)
BASOS PCT: 1 %
Eosinophils Absolute: 0 cells/uL — ABNORMAL LOW (ref 15–500)
Eosinophils Relative: 0 %
HEMATOCRIT: 37.6 % (ref 35.0–45.0)
HEMOGLOBIN: 11.7 g/dL (ref 11.7–15.5)
LYMPHS ABS: 933 {cells}/uL (ref 850–3900)
MCH: 24.5 pg — AB (ref 27.0–33.0)
MCHC: 31.1 g/dL — AB (ref 32.0–36.0)
MCV: 78.8 fL — ABNORMAL LOW (ref 80.0–100.0)
MONOS PCT: 17.5 %
MPV: 10.3 fL (ref 7.5–12.5)
NEUTROS ABS: 1593 {cells}/uL (ref 1500–7800)
Neutrophils Relative %: 51.4 %
Platelets: 279 10*3/uL (ref 140–400)
RBC: 4.77 10*6/uL (ref 3.80–5.10)
RDW: 16 % — ABNORMAL HIGH (ref 11.0–15.0)
Total Lymphocyte: 30.1 %
WBC mixed population: 543 cells/uL (ref 200–950)
WBC: 3.1 10*3/uL — AB (ref 3.8–10.8)

## 2017-06-11 LAB — C-REACTIVE PROTEIN: CRP: 5.6 mg/L (ref <8.0)

## 2017-06-11 LAB — SEDIMENTATION RATE: SED RATE: 19 mm/h (ref 0–30)

## 2017-08-11 ENCOUNTER — Ambulatory Visit: Payer: Self-pay | Admitting: Internal Medicine

## 2017-08-11 ENCOUNTER — Telehealth: Payer: Self-pay | Admitting: Behavioral Health

## 2017-08-18 ENCOUNTER — Telehealth: Payer: Self-pay | Admitting: Behavioral Health

## 2017-08-18 ENCOUNTER — Encounter: Payer: Self-pay | Admitting: Internal Medicine

## 2017-08-18 ENCOUNTER — Ambulatory Visit (INDEPENDENT_AMBULATORY_CARE_PROVIDER_SITE_OTHER): Payer: Self-pay | Admitting: Internal Medicine

## 2017-08-18 VITALS — BP 168/82 | HR 50 | Temp 97.6°F | Ht 64.0 in | Wt 189.0 lb

## 2017-08-18 DIAGNOSIS — T8450XS Infection and inflammatory reaction due to unspecified internal joint prosthesis, sequela: Secondary | ICD-10-CM

## 2017-08-18 NOTE — Progress Notes (Signed)
RFV: osteo  Patient ID: Kathy Jennings, female   DOB: [birth year], 66 y.o.   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 She had sustained some leukopenia associated with vancomycin which had been stopped. Wbc nadir of 2.0 at end of august but resolved after stopping abtx. She has been taking her oral abtx of cephalexin plus rif. We last saw her in the beginning of October. Doing well, though she has noticed having neuropathy to her finger tips x 6-8 wks    Outpatient Encounter Medications as of 08/18/2017  Medication Sig  . aspirin 81 MG chewable tablet Chew 1 tablet (81 mg total) by mouth 2 (two) times daily. Take for 4 weeks, then resume regular dose.  . Cephalexin 500 MG tablet Take 1 tablet (500 mg total) by mouth 4 (four) times daily.  . Cholecalciferol (VITAMIN D3) 2000 units capsule Take 2,000 Units by mouth daily with lunch.  . fluconazole (DIFLUCAN) 150 MG tablet Take 1 tablet (150 mg total) by mouth daily. Every 3 days if needed for yeast infection (Patient not taking: Reported on 06/10/2017)  . loratadine (CLARITIN) 10 MG tablet Take 10 mg by mouth daily with lunch.  . Multiple Vitamin (MULTIVITAMIN WITH MINERALS) TABS tablet Take 1 tablet by mouth daily with lunch.  . Omega-3 Fatty Acids (FISH OIL) 1200 MG CAPS Take 2,400 mg by mouth daily with lunch.  Marland Kitchen. omeprazole (PRILOSEC) 20 MG capsule Take 20 mg by mouth daily with lunch.   . polyethylene glycol (MIRALAX / GLYCOLAX) packet Take 17 g by mouth 2 (two) times daily.  . rifampin (RIFADIN) 300 MG capsule Take 300 mg by mouth 2 (two) times daily.  . sodium chloride (OCEAN) 0.65 % SOLN nasal spray Place 1 spray into both nostrils as needed for congestion.   No facility-administered encounter  medications on file as of 08/18/2017.      Patient Active Problem List   Diagnosis Date Noted  . Therapeutic drug monitoring   . 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 S/P right TKA 10/31/2014  . S/P knee replacement 10/31/2014  . Postoperative anemia due to acute blood loss 04/19/2014  . Obese 04/19/2014  . S/P right THA, AA 04/18/2014     Health Maintenance Due  Topic Date Due  . Hepatitis C Screening  [id number]  . HIV Screening  04/23/1968  . TETANUS/TDAP  04/23/1972  . PAP SMEAR  04/23/1974  . MAMMOGRAM  04/24/2003  . COLONOSCOPY  04/24/2003     Review of Systems Some pain to ambulation of right knee. Numbness to finger tips bilaterally. Otherwise 12 point ros is negative Physical Exam   BP (!) 168/82   Pulse (!) 50   Temp 97.6 F (36.4 C) (Oral)   Ht 5\' 4"  (1.626 m)   Wt 189 lb (85.7 kg)   BMI 32.44 kg/m   Physical Exam  Constitutional:  oriented to person, place, and time. appears well-developed and well-nourished. No distress.  HENT: [place]/AT, PERRLA, no scleral icterus Mouth/Throat: Oropharynx is clear and moist. No oropharyngeal exudate.  Cardiovascular: Normal rate, regular rhythm and normal heart sounds. Exam reveals no gallop and no friction rub.  No murmur heard.  Pulmonary/Chest: Effort normal and breath sounds normal.  No respiratory distress.  has no wheezes.  Ext: right knee well healed incision, slight erythema, non blanching 1cm to each side of incision on distal aspect of the incision. No purulence, fluctuance or warmth. +1 to trace edema on LE bilaterally Skin: Skin is warm and dry. No rash noted. No erythema.  Psychiatric: a normal mood and affect.  behavior is normal.   CBC Lab Results  Component Value Date   WBC 3.1 (L) 06/10/2017   RBC 4.77 06/10/2017   HGB 11.7 06/10/2017   HCT 37.6 06/10/2017   PLT 279 06/10/2017   MCV 78.8 (L) 06/10/2017   MCH 24.5 (L) 06/10/2017   MCHC 31.1 (L) 06/10/2017   RDW 16.0 (H) 06/10/2017    LYMPHSABS 933 06/10/2017   MONOABS 0.5 05/11/2017   EOSABS 0 (L) 06/10/2017    BMET Lab Results  Component Value Date   NA 139 06/10/2017   K 4.1 06/10/2017   CL 103 06/10/2017   CO2 26 06/10/2017   GLUCOSE 106 (H) 06/10/2017   BUN 19 06/10/2017   CREATININE 0.83 06/10/2017   CALCIUM 9.9 06/10/2017   GFRNONAA 75 06/10/2017   GFRAA 86 06/10/2017    Lab Results  Component Value Date   ESRSEDRATE 19 06/10/2017   Lab Results  Component Value Date   CRP 5.6 06/10/2017     Assessment and Plan  Peripheral neuropathy - will stop rifampin - will do 1 month of vitamin b6 at 50mg  daily to see if it helps resolve symptoms  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 - sees dr Charlann Boxerolin at the beginning of the year  rtc in 2-3 mo.

## 2017-08-18 NOTE — Telephone Encounter (Signed)
Spoke to Mrs. Kathy Jennings this evening and let her know Dr. Drue SecondSnider recommended that she take Vitamin B6 50 mg daily on an empty stomach.  Patient verbalized understanding.

## 2017-08-18 NOTE — Telephone Encounter (Signed)
Called patient to inform her Dr. Drue SecondSnider recommended Vitamin B6 50mg  daily once on an empty stomach.  Patient did not answer and writer left a message to call Myself Morrie Sheldon(Nameer Summer) back to give her this information.

## 2017-08-19 LAB — COMPLETE METABOLIC PANEL WITH GFR
AG Ratio: 1.6 (calc) (ref 1.0–2.5)
ALBUMIN MSPROF: 4.1 g/dL (ref 3.6–5.1)
ALT: 30 U/L — AB (ref 6–29)
AST: 17 U/L (ref 10–35)
Alkaline phosphatase (APISO): 90 U/L (ref 33–130)
BUN: 15 mg/dL (ref 7–25)
CALCIUM: 9.5 mg/dL (ref 8.6–10.4)
CHLORIDE: 104 mmol/L (ref 98–110)
CO2: 26 mmol/L (ref 20–32)
CREATININE: 0.51 mg/dL (ref 0.50–0.99)
GFR, EST AFRICAN AMERICAN: 118 mL/min/{1.73_m2} (ref >=60)
GFR, Est Non African American: 102 mL/min/{1.73_m2} (ref >=60)
GLUCOSE: 100 mg/dL — AB (ref 65–99)
Globulin: 2.5 g/dL (calc) (ref 1.9–3.7)
Potassium: 4.1 mmol/L (ref 3.5–5.3)
Sodium: 138 mmol/L (ref 135–146)
TOTAL PROTEIN: 6.6 g/dL (ref 6.1–8.1)
Total Bilirubin: 0.5 mg/dL (ref 0.2–1.2)

## 2017-08-19 LAB — CBC WITH DIFFERENTIAL/PLATELET
BASOS ABS: 20 {cells}/uL (ref 0–200)
BASOS PCT: 0.7 %
EOS PCT: 0 %
Eosinophils Absolute: 0 cells/uL — ABNORMAL LOW (ref 15–500)
HEMATOCRIT: 34.5 % — AB (ref 35.0–45.0)
HEMOGLOBIN: 11.3 g/dL — AB (ref 11.7–15.5)
LYMPHS ABS: 1002 {cells}/uL (ref 850–3900)
MCH: 26.3 pg — ABNORMAL LOW (ref 27.0–33.0)
MCHC: 32.8 g/dL (ref 32.0–36.0)
MCV: 80.4 fL (ref 80.0–100.0)
MONOS PCT: 14.7 %
MPV: 10.4 fL (ref 7.5–12.5)
NEUTROS ABS: 1366 {cells}/uL — AB (ref 1500–7800)
Neutrophils Relative %: 48.8 %
Platelets: 263 10*3/uL (ref 140–400)
RBC: 4.29 10*6/uL (ref 3.80–5.10)
RDW: 17.8 % — ABNORMAL HIGH (ref 11.0–15.0)
Total Lymphocyte: 35.8 %
WBC mixed population: 412 cells/uL (ref 200–950)
WBC: 2.8 10*3/uL — AB (ref 3.8–10.8)

## 2017-08-19 LAB — SEDIMENTATION RATE: SED RATE: 9 mm/h (ref 0–30)

## 2017-08-19 LAB — C-REACTIVE PROTEIN: CRP: 3.6 mg/L (ref <8.0)

## 2017-09-10 ENCOUNTER — Ambulatory Visit: Payer: Self-pay | Admitting: Internal Medicine

## 2017-09-25 ENCOUNTER — Other Ambulatory Visit: Payer: Self-pay | Admitting: Internal Medicine

## 2017-11-03 ENCOUNTER — Ambulatory Visit: Payer: Self-pay | Admitting: Internal Medicine

## 2017-11-04 NOTE — Telephone Encounter (Signed)
Left message

## 2018-04-07 DIAGNOSIS — M1711 Unilateral primary osteoarthritis, right knee: Secondary | ICD-10-CM | POA: Diagnosis not present

## 2018-04-14 DIAGNOSIS — G8929 Other chronic pain: Secondary | ICD-10-CM | POA: Diagnosis not present

## 2018-04-14 DIAGNOSIS — M810 Age-related osteoporosis without current pathological fracture: Secondary | ICD-10-CM | POA: Diagnosis not present

## 2018-04-14 DIAGNOSIS — Z791 Long term (current) use of non-steroidal anti-inflammatories (NSAID): Secondary | ICD-10-CM | POA: Diagnosis not present

## 2018-04-14 DIAGNOSIS — K219 Gastro-esophageal reflux disease without esophagitis: Secondary | ICD-10-CM | POA: Diagnosis not present

## 2018-04-14 DIAGNOSIS — R32 Unspecified urinary incontinence: Secondary | ICD-10-CM | POA: Diagnosis not present

## 2018-04-14 DIAGNOSIS — K08409 Partial loss of teeth, unspecified cause, unspecified class: Secondary | ICD-10-CM | POA: Diagnosis not present

## 2018-04-14 DIAGNOSIS — M1990 Unspecified osteoarthritis, unspecified site: Secondary | ICD-10-CM | POA: Diagnosis not present

## 2018-04-14 DIAGNOSIS — Z7982 Long term (current) use of aspirin: Secondary | ICD-10-CM | POA: Diagnosis not present

## 2018-04-14 DIAGNOSIS — J309 Allergic rhinitis, unspecified: Secondary | ICD-10-CM | POA: Diagnosis not present

## 2018-04-14 DIAGNOSIS — L089 Local infection of the skin and subcutaneous tissue, unspecified: Secondary | ICD-10-CM | POA: Diagnosis not present

## 2018-05-24 DIAGNOSIS — R69 Illness, unspecified: Secondary | ICD-10-CM | POA: Diagnosis not present

## 2018-05-24 DIAGNOSIS — R7301 Impaired fasting glucose: Secondary | ICD-10-CM | POA: Diagnosis not present

## 2018-05-24 DIAGNOSIS — Z1159 Encounter for screening for other viral diseases: Secondary | ICD-10-CM | POA: Diagnosis not present

## 2018-05-24 DIAGNOSIS — E7800 Pure hypercholesterolemia, unspecified: Secondary | ICD-10-CM | POA: Diagnosis not present

## 2018-05-24 DIAGNOSIS — K210 Gastro-esophageal reflux disease with esophagitis: Secondary | ICD-10-CM | POA: Diagnosis not present

## 2018-05-26 DIAGNOSIS — Z6836 Body mass index (BMI) 36.0-36.9, adult: Secondary | ICD-10-CM | POA: Diagnosis not present

## 2018-05-26 DIAGNOSIS — M1711 Unilateral primary osteoarthritis, right knee: Secondary | ICD-10-CM | POA: Diagnosis not present

## 2018-05-26 DIAGNOSIS — R7989 Other specified abnormal findings of blood chemistry: Secondary | ICD-10-CM | POA: Diagnosis not present

## 2018-05-26 DIAGNOSIS — E7800 Pure hypercholesterolemia, unspecified: Secondary | ICD-10-CM | POA: Diagnosis not present

## 2018-05-26 DIAGNOSIS — Z0001 Encounter for general adult medical examination with abnormal findings: Secondary | ICD-10-CM | POA: Diagnosis not present

## 2018-05-26 DIAGNOSIS — M1990 Unspecified osteoarthritis, unspecified site: Secondary | ICD-10-CM | POA: Diagnosis not present

## 2018-05-26 DIAGNOSIS — Z23 Encounter for immunization: Secondary | ICD-10-CM | POA: Diagnosis not present

## 2018-06-07 DIAGNOSIS — Z1231 Encounter for screening mammogram for malignant neoplasm of breast: Secondary | ICD-10-CM | POA: Diagnosis not present

## 2018-06-21 DIAGNOSIS — R69 Illness, unspecified: Secondary | ICD-10-CM | POA: Diagnosis not present

## 2018-07-06 DIAGNOSIS — H1045 Other chronic allergic conjunctivitis: Secondary | ICD-10-CM | POA: Diagnosis not present

## 2018-07-06 DIAGNOSIS — H5203 Hypermetropia, bilateral: Secondary | ICD-10-CM | POA: Diagnosis not present

## 2018-07-06 DIAGNOSIS — H52221 Regular astigmatism, right eye: Secondary | ICD-10-CM | POA: Diagnosis not present

## 2018-07-06 DIAGNOSIS — H25813 Combined forms of age-related cataract, bilateral: Secondary | ICD-10-CM | POA: Diagnosis not present

## 2018-07-08 ENCOUNTER — Encounter: Payer: Self-pay | Admitting: "Endocrinology

## 2018-07-08 DIAGNOSIS — E039 Hypothyroidism, unspecified: Secondary | ICD-10-CM | POA: Diagnosis not present

## 2018-07-08 DIAGNOSIS — R7989 Other specified abnormal findings of blood chemistry: Secondary | ICD-10-CM | POA: Diagnosis not present

## 2018-07-08 DIAGNOSIS — R946 Abnormal results of thyroid function studies: Secondary | ICD-10-CM | POA: Diagnosis not present

## 2018-07-08 LAB — TSH: TSH: 0.03 — AB (ref <=5.90)

## 2018-09-09 ENCOUNTER — Ambulatory Visit (INDEPENDENT_AMBULATORY_CARE_PROVIDER_SITE_OTHER): Payer: Medicare HMO | Admitting: "Endocrinology

## 2018-09-09 ENCOUNTER — Encounter: Payer: Self-pay | Admitting: "Endocrinology

## 2018-09-09 VITALS — BP 142/82 | HR 56 | Ht 62.0 in | Wt 206.0 lb

## 2018-09-09 DIAGNOSIS — E039 Hypothyroidism, unspecified: Secondary | ICD-10-CM | POA: Diagnosis not present

## 2018-09-09 DIAGNOSIS — R7309 Other abnormal glucose: Secondary | ICD-10-CM | POA: Diagnosis not present

## 2018-09-09 DIAGNOSIS — E0590 Thyrotoxicosis, unspecified without thyrotoxic crisis or storm: Secondary | ICD-10-CM | POA: Diagnosis not present

## 2018-09-09 DIAGNOSIS — R946 Abnormal results of thyroid function studies: Secondary | ICD-10-CM | POA: Diagnosis not present

## 2018-09-09 DIAGNOSIS — R7989 Other specified abnormal findings of blood chemistry: Secondary | ICD-10-CM | POA: Diagnosis not present

## 2018-09-09 DIAGNOSIS — R760 Raised antibody titer: Secondary | ICD-10-CM | POA: Diagnosis not present

## 2018-09-09 NOTE — Progress Notes (Signed)
Endocrinology Consult Note    Subjective:    Patient ID: Kathy Jennings, female    DOB: 1951-03-27, PCP Sasser, Clarene Critchley, MD.   Past Medical History:  Diagnosis Date  . Arthritis   . Complication of anesthesia    slow to wake up  . GERD (gastroesophageal reflux disease)   . OAB (overactive bladder)   . Peripheral vascular disease (HCC)   . PONV (postoperative nausea and vomiting)   . Urgency of urination   . Varicose veins    Past Surgical History:  Procedure Laterality Date  . ABDOMINAL HYSTERECTOMY  30 YRS AGO  . COLONOSCOPY  2005  . I&D KNEE WITH POLY EXCHANGE Right 03/31/2017   Procedure: RIGHT KNEE IRRIGATION AND DEBRIDEMENT WITH POLY EXCHANGE;  Surgeon: Durene Romans, MD;  Location: [facility];  Service: Orthopedics;  Laterality: Right;  120 mins  . TOTAL HIP ARTHROPLASTY Right 04/18/2014   Procedure: RIGHT TOTAL HIP ARTHROPLASTY ANTERIOR APPROACH;  Surgeon: Shelda Pal, MD;  Location: [facility];  Service: Orthopedics;  Laterality: Right;  . TOTAL KNEE ARTHROPLASTY Right 10/31/2014   Procedure: TOTAL RIGHT KNEE ARTHROPLASTY;  Surgeon: Shelda Pal, MD;  Location: [facility];  Service: Orthopedics;  Laterality: Right;   Social History   Socioeconomic History  . Marital status: Married    Spouse name: Not on file  . Number of children: Not on file  . Years of education: Not on file  . Highest education level: Not on file  Occupational History  . Not on file  Social Needs  . Financial resource strain: Not on file  . Food insecurity:    Worry: Not on file    Inability: Not on file  . Transportation needs:    Medical: Not on file    Non-medical: Not on file  Tobacco Use  . Smoking status: Never Smoker  . Smokeless tobacco: Never Used  Substance and Sexual Activity  . Alcohol use: No  . Drug use: No  . Sexual activity: Not on file  Lifestyle  . Physical activity:    Days per week: Not on file    Minutes per session: Not on file  . Stress: Not on file   Relationships  . Social connections:    Talks on phone: Not on file    Gets together: Not on file    Attends religious service: Not on file    Active member of club or organization: Not on file    Attends meetings of clubs or organizations: Not on file    Relationship status: Not on file  Other Topics Concern  . Not on file  Social History Narrative  . Not on file   Outpatient Encounter Medications as of 09/09/2018  Medication Sig  . aspirin 81 MG chewable tablet Chew 1 tablet (81 mg total) by mouth 2 (two) times daily. Take for 4 weeks, then resume regular dose.  . Cholecalciferol (VITAMIN D3) 2000 units capsule Take 2,000 Units by mouth daily with lunch.  . loratadine (CLARITIN) 10 MG tablet Take 10 mg by mouth daily with lunch.  . Multiple Vitamin (MULTIVITAMIN WITH MINERALS) TABS tablet Take 1 tablet by mouth daily with lunch.  Marland Kitchen omeprazole (PRILOSEC) 20 MG capsule Take 20 mg by mouth daily with lunch.   . Omega-3 Fatty Acids (FISH OIL) 1200 MG CAPS Take 2,400 mg by mouth daily with lunch.  . [DISCONTINUED] cephALEXin (KEFLEX) 500 MG capsule TAKE 1 CAPSULE BY MOUTH 4 TIMES DAILY  . [DISCONTINUED]  Cephalexin 500 MG tablet Take 1 tablet (500 mg total) by mouth 4 (four) times daily.  . [DISCONTINUED] fluconazole (DIFLUCAN) 150 MG tablet Take 1 tablet (150 mg total) by mouth daily. Every 3 days if needed for yeast infection (Patient not taking: Reported on 08/18/2017)  . [DISCONTINUED] polyethylene glycol (MIRALAX / GLYCOLAX) packet Take 17 g by mouth 2 (two) times daily.  . [DISCONTINUED] rifampin (RIFADIN) 300 MG capsule Take 300 mg by mouth 2 (two) times daily.  . [DISCONTINUED] sodium chloride (OCEAN) 0.65 % SOLN nasal spray Place 1 spray into both nostrils as needed for congestion.   No facility-administered encounter medications on file as of 09/09/2018.     ALLERGIES: Allergies  Allergen Reactions  . Sulfa Antibiotics Nausea And Vomiting    VACCINATION STATUS: Immunization  History  Administered Date(s) Administered  . Influenza,inj,Quad PF,6+ Mos 05/06/2017     HPI  Kathy Jennings is 68 y.o. female who presents today with a medical history as above. she is being seen in consultation for hyperthyroidism requested by Estanislado Pandy, MD.   Patient denies any prior history of thyroid dysfunction.  On July 08, 2018 on a routine lab work she was found to have suppressed TSH of 0.027 associated with a slightly above target total T4 of 13.4.  -She denies weight loss, palpitations, tremors, nor heat intolerance.  she denies dysphagia, choking, shortness of breath, no recent voice change.    she denies family history of thyroid dysfunction, or any family history of thyroid malignancy.  she denies personal history of goiter. she is not on any anti-thyroid medications nor on any thyroid hormone supplements. she  is willing to proceed with appropriate work up and therapy for thyrotoxicosis.                           Review of systems  Constitutional:- weight loss, + fatigue, + subjective hyperthermia Eyes: no blurry vision, - xerophthalmia ENT: no sore throat, no nodules palpated in throat, no dysphagia/odynophagia, nor hoarseness Cardiovascular: no Chest Pain, no Shortness of Breath, -  palpitations, no leg swelling Respiratory: no cough, no SOB Gastrointestinal: no Nausea, no Vomiting, no Diarhhea Musculoskeletal: no muscle/joint aches Skin: no rashes Neurological: -  tremors, no numbness, no tingling, no dizziness Psychiatric: no depression, -  anxiety   Objective:    BP (!) 142/82   Pulse (!) 56   Ht 5\' 2"  (1.575 m)   Wt 206 lb (93.4 kg)   BMI 37.68 kg/m   Wt Readings from Last 3 Encounters:  09/09/18 206 lb (93.4 kg)  08/18/17 189 lb (85.7 kg)  06/10/17 184 lb (83.5 kg)                                                Physical exam  Constitutional: + obese for height, not in acute distress, + normal state of mind Eyes: PERRLA, EOMI, -  exophthalmos ENT: moist mucous membranes, -  thyromegaly, no cervical lymphadenopathy Cardiovascular: + Normal precordial activity, + normal  rate and Rhythm, no Murmur/Rubs/Gallops Respiratory:  adequate breathing efforts, no gross chest deformity, Clear to auscultation bilaterally Gastrointestinal: abdomen soft, Non -tender, No distension, Bowel Sounds present Musculoskeletal: no gross deformities, strength intact in all four extremities Skin: moist, warm, no rashes Neurological: -  tremor with outstretched hands,  +  Deep Tendon Reflexes  on both lower extremities.   CMP     Component Value Date/Time   NA 138 08/18/2017 0912   K 4.1 08/18/2017 0912   CL 104 08/18/2017 0912   CO2 26 08/18/2017 0912   GLUCOSE 100 (H) 08/18/2017 0912   BUN 15 08/18/2017 0912   CREATININE 0.51 08/18/2017 0912   CALCIUM 9.5 08/18/2017 0912   PROT 6.6 08/18/2017 0912   AST 17 08/18/2017 0912   ALT 30 (H) 08/18/2017 0912   BILITOT 0.5 08/18/2017 0912   GFRNONAA 102 08/18/2017 0912   GFRAA 118 08/18/2017 0912     CBC    Component Value Date/Time   WBC 2.8 (L) 08/18/2017 0912   RBC 4.29 08/18/2017 0912   HGB 11.3 (L) 08/18/2017 0912   HCT 34.5 (L) 08/18/2017 0912   PLT 263 08/18/2017 0912   MCV 80.4 08/18/2017 0912   MCH 26.3 (L) 08/18/2017 0912   MCHC 32.8 08/18/2017 0912   RDW 17.8 (H) 08/18/2017 0912   LYMPHSABS 1,002 08/18/2017 0912   MONOABS 0.5 05/11/2017 0815   EOSABS 0 (L) 08/18/2017 0912   BASOSABS 20 08/18/2017 0912    Lab Results  Component Value Date   TSH 0.03 (A) 07/08/2018     Assessment & Plan:   1.  hyperthyroidism she is being seen at a kind request of Sasser, Clarene CritchleyPaul W, MD. her history and most recent labs are reviewed, and she was examined clinically.  -She has lab evidence of mild thyrotoxicosis, however patient has no clinical signs nor symptoms.    -She is offered repeat full profile thyroid function tests before treatment decisions.  She will return in 10 days  with her thyroid function test results.  If she is found to have evidence of primary hyperthyroidism, she will be considered for thyroid uptake and scan.  -I did not prescribe any medications for her today. - I advised her to maintain close follow up with Sasser, Clarene CritchleyPaul W, MD for primary care needs.   - Time spent with the patient: 35 minutes, of which >50% was spent in obtaining information about her symptoms, reviewing her previous labs, evaluations, and treatments, counseling her about her thyrotoxicosis, and developing a plan to confirm the diagnosis and long term treatment as necessary.  Kathy BetterKathy A Jennings participated in the discussions, expressed understanding, and voiced agreement with the above plans.  All questions were answered to her satisfaction. she is encouraged to contact clinic should she have any questions or concerns prior to her return visit.  Follow up plan: Return in about 10 days (around 09/19/2018) for Follow up with Pre-visit Labs.   Thank you for involving me in the care of this pleasant patient, and I will continue to update you with her progress.  Marquis LunchGebre Adian Jablonowski, MD [facility] Phone: [phone]  Fax: [phone]   09/09/2018, 1:47 PM  This note was partially dictated with voice recognition software. Similar sounding words can be transcribed inadequately or may not  be corrected upon review.

## 2018-09-14 LAB — BASIC METABOLIC PANEL
BUN: 17 (ref 4–21)
Creatinine: 0.6 (ref 0.5–1.1)

## 2018-09-14 LAB — VITAMIN D 25 HYDROXY (VIT D DEFICIENCY, FRACTURES): Vit D, 25-Hydroxy: 44

## 2018-09-14 LAB — HEMOGLOBIN A1C: Hemoglobin A1C: 5.7

## 2018-09-14 LAB — TSH: TSH: 5.8 (ref 0.41–5.90)

## 2018-09-16 DIAGNOSIS — H40013 Open angle with borderline findings, low risk, bilateral: Secondary | ICD-10-CM | POA: Diagnosis not present

## 2018-09-16 DIAGNOSIS — H52222 Regular astigmatism, left eye: Secondary | ICD-10-CM | POA: Diagnosis not present

## 2018-09-16 DIAGNOSIS — H25813 Combined forms of age-related cataract, bilateral: Secondary | ICD-10-CM | POA: Diagnosis not present

## 2018-09-23 ENCOUNTER — Ambulatory Visit (INDEPENDENT_AMBULATORY_CARE_PROVIDER_SITE_OTHER): Payer: Medicare HMO | Admitting: "Endocrinology

## 2018-09-23 ENCOUNTER — Encounter: Payer: Self-pay | Admitting: "Endocrinology

## 2018-09-23 VITALS — BP 141/69 | HR 71 | Ht 62.0 in | Wt 208.0 lb

## 2018-09-23 DIAGNOSIS — E039 Hypothyroidism, unspecified: Secondary | ICD-10-CM

## 2018-09-23 DIAGNOSIS — E038 Other specified hypothyroidism: Secondary | ICD-10-CM

## 2018-09-23 NOTE — Progress Notes (Signed)
Endocrinology follow up  Note    Subjective:    Patient ID: Kathy Jennings, female    DOB: [birth year], PCP Sasser, Clarene Critchley, MD.   Past Medical History:  Diagnosis Date  . Arthritis   . Complication of anesthesia    slow to wake up  . GERD (gastroesophageal reflux disease)   . OAB (overactive bladder)   . Peripheral vascular disease (HCC)   . PONV (postoperative nausea and vomiting)   . Urgency of urination   . Varicose veins    Past Surgical History:  Procedure Laterality Date  . ABDOMINAL HYSTERECTOMY  30 YRS AGO  . COLONOSCOPY  2005  . I&D KNEE WITH POLY EXCHANGE Right 03/31/2017   Procedure: RIGHT KNEE IRRIGATION AND DEBRIDEMENT WITH POLY EXCHANGE;  Surgeon: Durene Romans, MD;  Location: [facility];  Service: Orthopedics;  Laterality: Right;  120 mins  . TOTAL HIP ARTHROPLASTY Right 04/18/2014   Procedure: RIGHT TOTAL HIP ARTHROPLASTY ANTERIOR APPROACH;  Surgeon: Shelda Pal, MD;  Location: [facility];  Service: Orthopedics;  Laterality: Right;  . TOTAL KNEE ARTHROPLASTY Right 10/31/2014   Procedure: TOTAL RIGHT KNEE ARTHROPLASTY;  Surgeon: Shelda Pal, MD;  Location: [facility];  Service: Orthopedics;  Laterality: Right;   Social History   Socioeconomic History  . Marital status: Married    Spouse name: Not on file  . Number of children: Not on file  . Years of education: Not on file  . Highest education level: Not on file  Occupational History  . Not on file  Social Needs  . Financial resource strain: Not on file  . Food insecurity:    Worry: Not on file    Inability: Not on file  . Transportation needs:    Medical: Not on file    Non-medical: Not on file  Tobacco Use  . Smoking status: Never Smoker  . Smokeless tobacco: Never Used  Substance and Sexual Activity  . Alcohol use: No  . Drug use: No  . Sexual activity: Not on file  Lifestyle  . Physical activity:    Days per week: Not on file    Minutes per session: Not on file  . Stress: Not on file   Relationships  . Social connections:    Talks on phone: Not on file    Gets together: Not on file    Attends religious service: Not on file    Active member of club or organization: Not on file    Attends meetings of clubs or organizations: Not on file    Relationship status: Not on file  Other Topics Concern  . Not on file  Social History Narrative  . Not on file   Outpatient Encounter Medications as of 09/23/2018  Medication Sig  . ASPIRIN 81 PO daily.  . Cholecalciferol (VITAMIN D3) 2000 units capsule Take 2,000 Units by mouth daily with lunch.  . loratadine (CLARITIN) 10 MG tablet Take 10 mg by mouth daily with lunch.  . Multiple Vitamin (MULTIVITAMIN WITH MINERALS) TABS tablet Take 1 tablet by mouth daily with lunch.  . Omega-3 Fatty Acids (FISH OIL) 1200 MG CAPS Take 2,400 mg by mouth daily with lunch.  Marland Kitchen omeprazole (PRILOSEC) 20 MG capsule Take 20 mg by mouth daily with lunch.   . [DISCONTINUED] aspirin 81 MG chewable tablet Chew 1 tablet (81 mg total) by mouth 2 (two) times daily. Take for 4 weeks, then resume regular dose. (Patient taking differently: Chew 81 mg by mouth daily. )  No facility-administered encounter medications on file as of 09/23/2018.     ALLERGIES: Allergies  Allergen Reactions  . Sulfa Antibiotics Nausea And Vomiting    VACCINATION STATUS: Immunization History  Administered Date(s) Administered  . Influenza,inj,Quad PF,6+ Mos 05/06/2017     HPI  Kathy Jennings is 68 y.o. female who presents today with a medical history as above. she is here to f/u after being seen in consultation for hyperthyroidism. PMD :Estanislado Pandy, MD.   Patient denies any prior history of thyroid dysfunction.  On July 08, 2018 on a routine lab work she was found to have suppressed TSH of 0.027 associated with a slightly above target total T4 of 13.4.  with a dx of subclinical hyperthyroidism, she was not initiated on any antithyroid intervention. She returns with  labs c/w subclinical hypothyroidism.  -She denies weight loss, palpitations, tremors, nor heat intolerance.  she denies dysphagia, choking, shortness of breath, no recent voice change.    she denies family history of thyroid dysfunction, or any family history of thyroid malignancy.  she denies personal history of goiter.                            Review of systems  Constitutional:- weight change, + fatigue, - subjective hyperthermia Eyes: no blurry vision, - xerophthalmia ENT: no sore throat, no nodules palpated in throat, no dysphagia/odynophagia, nor hoarseness Cardiovascular: no Chest Pain, no Shortness of Breath, -  palpitations, no leg swelling  Gastrointestinal: no Nausea, no Vomiting, no Diarhhea Musculoskeletal: no muscle/joint aches Skin: no rashes Neurological: -  tremors, no numbness, no tingling, no dizziness Psychiatric: no depression, -  anxiety   Objective:    BP (!) 141/69   Pulse 71   Ht 5\' 2"  (1.575 m)   Wt 208 lb (94.3 kg)   BMI 38.04 kg/m   Wt Readings from Last 3 Encounters:  09/23/18 208 lb (94.3 kg)  09/09/18 206 lb (93.4 kg)  08/18/17 189 lb (85.7 kg)                                                Physical exam  Constitutional: + obese for height, not in acute distress, + normal state of mind Eyes: PERRLA, EOMI, - exophthalmos ENT: moist mucous membranes, -  thyromegaly, no cervical lymphadenopathy Cardiovascular: + Normal precordial activity, + normal  rate and Rhythm, no Murmur/Rubs/Gallops Respiratory:  adequate breathing efforts, no gross chest deformity, Clear to auscultation bilaterally Gastrointestinal: abdomen soft, Non -tender, No distension, Bowel Sounds present Musculoskeletal: no gross deformities, strength intact in all four extremities Skin: moist, warm, no rashes Neurological: -  tremor with outstretched hands,  + Deep Tendon Reflexes  on both lower extremities.   CMP     Component Value Date/Time   NA 138 08/18/2017 0912    K 4.1 08/18/2017 0912   CL 104 08/18/2017 0912   CO2 26 08/18/2017 0912   GLUCOSE 100 (H) 08/18/2017 0912   BUN 17 09/14/2018   CREATININE 0.6 09/14/2018   CREATININE 0.51 08/18/2017 0912   CALCIUM 9.5 08/18/2017 0912   PROT 6.6 08/18/2017 0912   AST 17 08/18/2017 0912   ALT 30 (H) 08/18/2017 0912   BILITOT 0.5 08/18/2017 0912   GFRNONAA 102 08/18/2017 0912   GFRAA 118 08/18/2017 0912  CBC    Component Value Date/Time   WBC 2.8 (L) 08/18/2017 0912   RBC 4.29 08/18/2017 0912   HGB 11.3 (L) 08/18/2017 0912   HCT 34.5 (L) 08/18/2017 0912   PLT 263 08/18/2017 0912   MCV 80.4 08/18/2017 0912   MCH 26.3 (L) 08/18/2017 0912   MCHC 32.8 08/18/2017 0912   RDW 17.8 (H) 08/18/2017 0912   LYMPHSABS 1,002 08/18/2017 0912   MONOABS 0.5 05/11/2017 0815   EOSABS 0 (L) 08/18/2017 0912   BASOSABS 20 08/18/2017 0912    Lab Results  Component Value Date   TSH 5.80 09/14/2018   TSH 0.03 (A) 07/08/2018     Assessment & Plan:   1.  Subclinical hyporthyroidism Her labs are reversed to subclinical hypothyroidism, and evidence of Hashimoto's background.  -She will not intervention for now, but patient likely is at risk for hypothyroidism. She will return in 6 months with repeat labs. -her labs rule out diabetes/prediabetes.  - I advised her to maintain close follow up with Sasser, Clarene CritchleyPaul W, MD for primary care needs.   Kathy Jennings participated in the discussions, expressed understanding, and voiced agreement with the above plans.  All questions were answered to her satisfaction. she is encouraged to contact clinic should she have any questions or concerns prior to her return visit.   Follow up plan: Return in about 6 months (around 03/24/2019) for Follow up with Pre-visit Labs.   Thank you for involving me in the care of this pleasant patient, and I will continue to update you with her progress.  Kathy LunchGebre Mikell Camp, MD [facility] Phone: [phone]  Fax: [phone]   09/23/2018, 8:10 PM  This note was partially dictated with voice recognition software. Similar sounding words can be transcribed inadequately or may not  be corrected upon review.

## 2018-09-30 DIAGNOSIS — E6609 Other obesity due to excess calories: Secondary | ICD-10-CM | POA: Diagnosis not present

## 2018-09-30 DIAGNOSIS — M545 Low back pain: Secondary | ICD-10-CM | POA: Diagnosis not present

## 2018-09-30 DIAGNOSIS — Z6837 Body mass index (BMI) 37.0-37.9, adult: Secondary | ICD-10-CM | POA: Diagnosis not present

## 2018-10-04 DIAGNOSIS — M5430 Sciatica, unspecified side: Secondary | ICD-10-CM | POA: Diagnosis not present

## 2018-10-04 DIAGNOSIS — Z6837 Body mass index (BMI) 37.0-37.9, adult: Secondary | ICD-10-CM | POA: Diagnosis not present

## 2018-10-04 DIAGNOSIS — H25811 Combined forms of age-related cataract, right eye: Secondary | ICD-10-CM | POA: Diagnosis not present

## 2018-10-04 DIAGNOSIS — M545 Low back pain: Secondary | ICD-10-CM | POA: Diagnosis not present

## 2018-10-06 ENCOUNTER — Encounter (HOSPITAL_COMMUNITY): Payer: Self-pay

## 2018-10-07 ENCOUNTER — Encounter (HOSPITAL_COMMUNITY)
Admission: RE | Admit: 2018-10-07 | Discharge: 2018-10-07 | Disposition: A | Discharge status: Home / Self Care | Payer: Medicare HMO | Source: Ambulatory Visit | Attending: Ophthalmology | Admitting: Ophthalmology

## 2018-10-11 ENCOUNTER — Ambulatory Visit (HOSPITAL_COMMUNITY)
Admission: RE | Admit: 2018-10-11 | Discharge: 2018-10-11 | Disposition: A | Discharge status: Home / Self Care | Payer: Medicare HMO | Attending: Ophthalmology | Admitting: Ophthalmology

## 2018-10-11 ENCOUNTER — Ambulatory Visit (HOSPITAL_COMMUNITY): Payer: Medicare HMO | Admitting: Anesthesiology

## 2018-10-11 ENCOUNTER — Encounter (HOSPITAL_COMMUNITY): Payer: Self-pay | Admitting: *Deleted

## 2018-10-11 ENCOUNTER — Encounter (HOSPITAL_COMMUNITY)
Admission: RE | Disposition: A | Discharge status: Home / Self Care | Payer: Self-pay | Source: Home / Self Care | Attending: Ophthalmology

## 2018-10-11 DIAGNOSIS — H52222 Regular astigmatism, left eye: Secondary | ICD-10-CM | POA: Insufficient documentation

## 2018-10-11 DIAGNOSIS — Z79899 Other long term (current) drug therapy: Secondary | ICD-10-CM | POA: Diagnosis not present

## 2018-10-11 DIAGNOSIS — Z791 Long term (current) use of non-steroidal anti-inflammatories (NSAID): Secondary | ICD-10-CM | POA: Diagnosis not present

## 2018-10-11 DIAGNOSIS — I739 Peripheral vascular disease, unspecified: Secondary | ICD-10-CM | POA: Insufficient documentation

## 2018-10-11 DIAGNOSIS — Z7982 Long term (current) use of aspirin: Secondary | ICD-10-CM | POA: Diagnosis not present

## 2018-10-11 DIAGNOSIS — K219 Gastro-esophageal reflux disease without esophagitis: Secondary | ICD-10-CM | POA: Diagnosis not present

## 2018-10-11 DIAGNOSIS — H25811 Combined forms of age-related cataract, right eye: Secondary | ICD-10-CM | POA: Insufficient documentation

## 2018-10-11 DIAGNOSIS — E039 Hypothyroidism, unspecified: Secondary | ICD-10-CM | POA: Diagnosis not present

## 2018-10-11 HISTORY — PX: CATARACT EXTRACTION W/PHACO: SHX586

## 2018-10-11 SURGERY — PHACOEMULSIFICATION, CATARACT, WITH IOL INSERTION
Anesthesia: Monitor Anesthesia Care | Site: Eye | Laterality: Right

## 2018-10-11 MED ORDER — EPINEPHRINE PF 1 MG/ML IJ SOLN
INTRAMUSCULAR | Status: AC
  Filled 2018-10-11: qty 2

## 2018-10-11 MED ORDER — BSS IO SOLN
INTRAOCULAR | Status: DC | PRN
  Administered 2018-10-11: 15 mL via INTRAOCULAR

## 2018-10-11 MED ORDER — LIDOCAINE HCL (PF) 1 % IJ SOLN
INTRAOCULAR | Status: DC | PRN
  Administered 2018-10-11: 1 mL via OPHTHALMIC

## 2018-10-11 MED ORDER — SODIUM HYALURONATE 23 MG/ML IO SOLN
INTRAOCULAR | Status: DC | PRN
  Administered 2018-10-11: 0.6 mL via INTRAOCULAR

## 2018-10-11 MED ORDER — LIDOCAINE HCL 3.5 % OP GEL
1.0000 "application " | Freq: Once | OPHTHALMIC | Status: AC
  Administered 2018-10-11: 1 via OPHTHALMIC

## 2018-10-11 MED ORDER — CYCLOPENTOLATE-PHENYLEPHRINE 0.2-1 % OP SOLN
1.0000 [drp] | OPHTHALMIC | Status: AC
  Administered 2018-10-11 (×3): 1 [drp] via OPHTHALMIC

## 2018-10-11 MED ORDER — MIDAZOLAM HCL 2 MG/2ML IJ SOLN
INTRAMUSCULAR | Status: AC
  Filled 2018-10-11: qty 2

## 2018-10-11 MED ORDER — SODIUM CHLORIDE 0.9% FLUSH
10.0000 mL | INTRAVENOUS | Status: DC | PRN
  Administered 2018-10-11: 6 mL via INTRAVENOUS

## 2018-10-11 MED ORDER — EPINEPHRINE PF 1 MG/ML IJ SOLN
INTRAOCULAR | Status: DC | PRN
  Administered 2018-10-11: 500 mL

## 2018-10-11 MED ORDER — POVIDONE-IODINE 5 % OP SOLN
OPHTHALMIC | Status: DC | PRN
  Administered 2018-10-11: 1 via OPHTHALMIC

## 2018-10-11 MED ORDER — MIDAZOLAM HCL 5 MG/5ML IJ SOLN
INTRAMUSCULAR | Status: DC | PRN
  Administered 2018-10-11: 2 mg via INTRAVENOUS

## 2018-10-11 MED ORDER — PHENYLEPHRINE HCL 2.5 % OP SOLN
1.0000 [drp] | OPHTHALMIC | Status: AC
  Administered 2018-10-11 (×3): 1 [drp] via OPHTHALMIC

## 2018-10-11 MED ORDER — PROVISC 10 MG/ML IO SOLN
INTRAOCULAR | Status: DC | PRN
  Administered 2018-10-11: 0.85 mL via INTRAOCULAR

## 2018-10-11 MED ORDER — TETRACAINE HCL 0.5 % OP SOLN
1.0000 [drp] | OPHTHALMIC | Status: AC
  Administered 2018-10-11 (×3): 1 [drp] via OPHTHALMIC

## 2018-10-11 SURGICAL SUPPLY — 16 items
CLOTH BEACON ORANGE TIMEOUT ST (SAFETY) ×1 IMPLANT
DEVICE MILOOP (MISCELLANEOUS) IMPLANT
EYE SHIELD UNIVERSAL CLEAR (GAUZE/BANDAGES/DRESSINGS) ×1 IMPLANT
GLOVE BIOGEL PI IND STRL 7.0 (GLOVE) IMPLANT
GLOVE BIOGEL PI INDICATOR 7.0 (GLOVE) ×2
LENS ALC ACRYL/TECN (Ophthalmic Related) ×1 IMPLANT
MILOOP DEVICE (MISCELLANEOUS)
NDL HYPO 18GX1.5 BLUNT FILL (NEEDLE) IMPLANT
NEEDLE HYPO 18GX1.5 BLUNT FILL (NEEDLE) ×2 IMPLANT
PAD ARMBOARD 7.5X6 YLW CONV (MISCELLANEOUS) ×1 IMPLANT
RING MALYGIN (MISCELLANEOUS) IMPLANT
SYR TB 1ML LL NO SAFETY (SYRINGE) ×1 IMPLANT
TAPE SURG TRANSPORE 1 IN (GAUZE/BANDAGES/DRESSINGS) IMPLANT
TAPE SURGICAL TRANSPORE 1 IN (GAUZE/BANDAGES/DRESSINGS) ×1
VISCOELASTIC ADDITIONAL (OPHTHALMIC RELATED) ×1 IMPLANT
WATER STERILE IRR 250ML POUR (IV SOLUTION) ×1 IMPLANT

## 2018-10-11 NOTE — Op Note (Addendum)
Date of procedure: 10/11/18  Pre-operative diagnosis: Visually significant age-related cataract, Right Eye (H25.811)  Post-operative diagnosis: Visually significant age-related cataract, Right Eye  Procedure: Removal of cataract via phacoemulsification and insertion of intra-ocular lens Wynetta Emery and [place]  +19.5D into the capsular bag of the Right Eye  Attending surgeon: Gerda Diss. Sadie Hazelett, MD, MA  Anesthesia: MAC, Topical [place]  Complications: None  Estimated Blood Loss: <61m (minimal)  Specimens: None  Implants: As above  Indications:  Visually significant age-related cataract, Right Eye  Procedure:  The patient was seen and identified in the pre-operative area. The operative eye was identified and dilated.  The operative eye was marked.  Topical anesthesia was administered to the operative eye.     The patient was then to the operative suite and placed in the supine position.  A timeout was performed confirming the patient, procedure to be performed, and all other relevant information.   The patient's face was prepped and draped in the usual fashion for intra-ocular surgery.  A lid speculum was placed into the operative eye and the surgical microscope moved into place and focused.  A superotemporal paracentesis was created using a 20 gauge paracentesis blade.  Shugarcaine was injected into the anterior chamber.  Viscoelastic was injected into the anterior chamber.  A temporal clear-corneal main wound incision was created using a 2.477mmicrokeratome.  A continuous curvilinear capsulorrhexis was initiated using an irrigating cystitome and completed using capsulorrhexis forceps.  Hydrodissection and hydrodeliniation were performed.  Viscoelastic was injected into the anterior chamber.  A phacoemulsification handpiece and a chopper as a second instrument were used to remove the nucleus and epinucleus. The irrigation/aspiration handpiece was used to remove any remaining cortical  material.   The capsular bag was reinflated with viscoelastic, checked, and found to be intact.  The intraocular lens was inserted into the capsular bag and dialed into place using a Kuglen hook.  The irrigation/aspiration handpiece was used to remove any remaining viscoelastic.  The clear corneal wound and paracentesis wounds were then hydrated and checked with Weck-Cels to be watertight.  The lid-speculum and drape was removed, and the patient's face was cleaned with a wet and dry 4x4.  A clear shield was taped over the eye. The patient was taken to the post-operative care unit in good condition, having tolerated the procedure well.  Post-Op Instructions: [facility] and will receive all other orders and instructions.

## 2018-10-11 NOTE — Anesthesia Procedure Notes (Signed)
Procedure Name: MAC Date/Time: 10/11/2018 10:24 AM Performed by: Vista Deck, CRNA Pre-anesthesia Checklist: Patient identified, Emergency Drugs available, Suction available, Timeout performed and Patient being monitored Patient Re-evaluated:Patient Re-evaluated prior to induction Oxygen Delivery Method: Nasal Cannula

## 2018-10-11 NOTE — H&P (Signed)
The H and P was reviewed and updated. The patient was examined.  No changes were found after exam.  The surgical eye was marked.  

## 2018-10-11 NOTE — Discharge Instructions (Addendum)
PATIENT INSTRUCTIONS °POST-ANESTHESIA ° °IMMEDIATELY FOLLOWING SURGERY:  Do not drive or operate machinery for the first twenty four hours after surgery.  Do not make any important decisions for twenty four hours after surgery or while taking narcotic pain medications or sedatives.  If you develop intractable nausea and vomiting or a severe headache please notify your doctor immediately. ° °FOLLOW-UP:  Please make an appointment with your surgeon as instructed. You do not need to follow up with anesthesia unless specifically instructed to do so. ° °WOUND CARE INSTRUCTIONS (if applicable):  Keep a dry clean dressing on the anesthesia/puncture wound site if there is drainage.  Once the wound has quit draining you may leave it open to air.  Generally you should leave the bandage intact for twenty four hours unless there is drainage.  If the epidural site drains for more than 36-48 hours please call the anesthesia department. ° °QUESTIONS?:  Please feel free to call your physician or the hospital operator if you have any questions, and they will be happy to assist you.    ° ° °Please discharge patient when stable, will follow up today with Dr. Wrzosek at the [facility] office immediately following discharge.  Leave shield in place until visit.  All paperwork with discharge instructions will be given at the office. ° °

## 2018-10-11 NOTE — Transfer of Care (Signed)
Immediate Anesthesia Transfer of Care Note  Patient: Kathy Jennings  Procedure(s) Performed: CATARACT EXTRACTION PHACO AND INTRAOCULAR LENS PLACEMENT RIGHT EYE (CDE: 5.67) (Right Eye)  Patient Location: Short Stay  Anesthesia Type:MAC  Level of Consciousness: awake, alert  and patient cooperative  Airway & Oxygen Therapy: Patient Spontanous Breathing  Post-op Assessment: Report given to RN and Post -op Vital signs reviewed and stable  Post vital signs: Reviewed and stable  Last Vitals:  Vitals Value Taken Time  BP    Temp    Pulse    Resp    SpO2      Last Pain:  Vitals:   10/11/18 0957  TempSrc: Oral  PainSc: 0-No pain      Patients Stated Pain Goal: 8 (93/26/71 2458)  Complications: No apparent anesthesia complications

## 2018-10-11 NOTE — Anesthesia Preprocedure Evaluation (Signed)
Anesthesia Evaluation    History of Anesthesia Complications (+) history of anesthetic complications  Airway Mallampati: II       Dental  (+) Upper Dentures, Teeth Intact   Pulmonary    breath sounds clear to auscultation       Cardiovascular + Peripheral Vascular Disease   Rhythm:regular     Neuro/Psych    GI/Hepatic GERD  ,  Endo/Other  Hypothyroidism   Renal/GU      Musculoskeletal   Abdominal   Peds  Hematology  (+) Blood dyscrasia, anemia ,   Anesthesia Other Findings Dose pak in progress for lower back pain  Reproductive/Obstetrics                             Anesthesia Physical Anesthesia Plan  ASA: II  Anesthesia Plan: MAC   Post-op Pain Management:    Induction:   PONV Risk Score and Plan:   Airway Management Planned:   Additional Equipment:   Intra-op Plan:   Post-operative Plan:   Informed Consent: I have reviewed the patients History and Physical, chart, labs and discussed the procedure including the risks, benefits and alternatives for the proposed anesthesia with the patient or authorized representative who has indicated his/her understanding and acceptance.       Plan Discussed with: Anesthesiologist  Anesthesia Plan Comments:         Anesthesia Quick Evaluation

## 2018-10-11 NOTE — Anesthesia Postprocedure Evaluation (Signed)
Anesthesia Post Note  Patient: Kathy Jennings  Procedure(s) Performed: CATARACT EXTRACTION PHACO AND INTRAOCULAR LENS PLACEMENT RIGHT EYE (CDE: 5.67) (Right Eye)  Patient location during evaluation: Short Stay Anesthesia Type: MAC Level of consciousness: awake and alert and patient cooperative Pain management: satisfactory to patient Vital Signs Assessment: post-procedure vital signs reviewed and stable Respiratory status: spontaneous breathing Cardiovascular status: stable Postop Assessment: no apparent nausea or vomiting Anesthetic complications: no     Last Vitals:  Vitals:   10/11/18 0957  BP: (!) 160/75  Pulse: (!) 48  Resp: 16  Temp: 36.7 C  SpO2: 99%    Last Pain:  Vitals:   10/11/18 0957  TempSrc: Oral  PainSc: 0-No pain                 Karisma Meiser

## 2018-10-12 ENCOUNTER — Encounter (HOSPITAL_COMMUNITY): Payer: Self-pay | Admitting: Ophthalmology

## 2018-10-12 DIAGNOSIS — M5430 Sciatica, unspecified side: Secondary | ICD-10-CM | POA: Diagnosis not present

## 2018-10-12 DIAGNOSIS — M25552 Pain in left hip: Secondary | ICD-10-CM | POA: Diagnosis not present

## 2018-10-12 DIAGNOSIS — Z6837 Body mass index (BMI) 37.0-37.9, adult: Secondary | ICD-10-CM | POA: Diagnosis not present

## 2018-10-12 DIAGNOSIS — M545 Low back pain: Secondary | ICD-10-CM | POA: Diagnosis not present

## 2018-10-12 DIAGNOSIS — R1030 Lower abdominal pain, unspecified: Secondary | ICD-10-CM | POA: Diagnosis not present

## 2018-10-15 ENCOUNTER — Encounter (HOSPITAL_COMMUNITY): Payer: Self-pay

## 2018-10-18 ENCOUNTER — Encounter (HOSPITAL_COMMUNITY)
Admission: RE | Admit: 2018-10-18 | Discharge: 2018-10-18 | Disposition: A | Discharge status: Home / Self Care | Payer: Medicare HMO | Source: Ambulatory Visit | Attending: Ophthalmology | Admitting: Ophthalmology

## 2018-10-18 DIAGNOSIS — R1900 Intra-abdominal and pelvic swelling, mass and lump, unspecified site: Secondary | ICD-10-CM | POA: Diagnosis not present

## 2018-10-18 DIAGNOSIS — M5136 Other intervertebral disc degeneration, lumbar region: Secondary | ICD-10-CM | POA: Diagnosis not present

## 2018-10-18 DIAGNOSIS — M4186 Other forms of scoliosis, lumbar region: Secondary | ICD-10-CM | POA: Diagnosis not present

## 2018-10-18 DIAGNOSIS — I700 Atherosclerosis of aorta: Secondary | ICD-10-CM | POA: Diagnosis not present

## 2018-10-18 DIAGNOSIS — K8080 Other cholelithiasis without obstruction: Secondary | ICD-10-CM | POA: Diagnosis not present

## 2018-10-18 DIAGNOSIS — K8020 Calculus of gallbladder without cholecystitis without obstruction: Secondary | ICD-10-CM | POA: Diagnosis not present

## 2018-10-18 DIAGNOSIS — H25812 Combined forms of age-related cataract, left eye: Secondary | ICD-10-CM | POA: Diagnosis not present

## 2018-10-19 DIAGNOSIS — M25552 Pain in left hip: Secondary | ICD-10-CM | POA: Diagnosis not present

## 2018-10-19 DIAGNOSIS — R262 Difficulty in walking, not elsewhere classified: Secondary | ICD-10-CM | POA: Diagnosis not present

## 2018-10-19 DIAGNOSIS — M545 Low back pain: Secondary | ICD-10-CM | POA: Diagnosis not present

## 2018-10-25 ENCOUNTER — Encounter (HOSPITAL_COMMUNITY)
Admission: RE | Disposition: A | Discharge status: Home / Self Care | Payer: Self-pay | Source: Home / Self Care | Attending: Ophthalmology

## 2018-10-25 ENCOUNTER — Ambulatory Visit (HOSPITAL_COMMUNITY): Payer: Medicare HMO | Admitting: Anesthesiology

## 2018-10-25 ENCOUNTER — Encounter (HOSPITAL_COMMUNITY): Payer: Self-pay | Admitting: *Deleted

## 2018-10-25 ENCOUNTER — Ambulatory Visit (HOSPITAL_COMMUNITY)
Admission: RE | Admit: 2018-10-25 | Discharge: 2018-10-25 | Disposition: A | Discharge status: Home / Self Care | Payer: Medicare HMO | Attending: Ophthalmology | Admitting: Ophthalmology

## 2018-10-25 DIAGNOSIS — Z9841 Cataract extraction status, right eye: Secondary | ICD-10-CM | POA: Insufficient documentation

## 2018-10-25 DIAGNOSIS — I739 Peripheral vascular disease, unspecified: Secondary | ICD-10-CM | POA: Insufficient documentation

## 2018-10-25 DIAGNOSIS — H25812 Combined forms of age-related cataract, left eye: Secondary | ICD-10-CM | POA: Diagnosis not present

## 2018-10-25 DIAGNOSIS — M1990 Unspecified osteoarthritis, unspecified site: Secondary | ICD-10-CM | POA: Diagnosis not present

## 2018-10-25 DIAGNOSIS — H259 Unspecified age-related cataract: Secondary | ICD-10-CM | POA: Diagnosis present

## 2018-10-25 HISTORY — PX: CATARACT EXTRACTION W/PHACO: SHX586

## 2018-10-25 SURGERY — PHACOEMULSIFICATION, CATARACT, WITH IOL INSERTION
Anesthesia: Monitor Anesthesia Care | Site: Eye | Laterality: Left

## 2018-10-25 MED ORDER — MIDAZOLAM HCL 2 MG/2ML IJ SOLN
INTRAMUSCULAR | Status: AC
  Filled 2018-10-25: qty 2

## 2018-10-25 MED ORDER — CYCLOPENTOLATE-PHENYLEPHRINE 0.2-1 % OP SOLN
1.0000 [drp] | OPHTHALMIC | Status: AC
  Administered 2018-10-25 (×3): 1 [drp] via OPHTHALMIC

## 2018-10-25 MED ORDER — TETRACAINE HCL 0.5 % OP SOLN
1.0000 [drp] | OPHTHALMIC | Status: AC
  Administered 2018-10-25 (×3): 1 [drp] via OPHTHALMIC

## 2018-10-25 MED ORDER — POVIDONE-IODINE 5 % OP SOLN
OPHTHALMIC | Status: DC | PRN
  Administered 2018-10-25: 1 via OPHTHALMIC

## 2018-10-25 MED ORDER — SODIUM HYALURONATE 23 MG/ML IO SOLN
INTRAOCULAR | Status: DC | PRN
  Administered 2018-10-25: 0.6 mL via INTRAOCULAR

## 2018-10-25 MED ORDER — EPINEPHRINE PF 1 MG/ML IJ SOLN
INTRAMUSCULAR | Status: AC
  Filled 2018-10-25: qty 2

## 2018-10-25 MED ORDER — LIDOCAINE HCL 3.5 % OP GEL
1.0000 "application " | Freq: Once | OPHTHALMIC | Status: AC
  Administered 2018-10-25: 1 via OPHTHALMIC

## 2018-10-25 MED ORDER — PROVISC 10 MG/ML IO SOLN
INTRAOCULAR | Status: DC | PRN
  Administered 2018-10-25: 0.85 mL via INTRAOCULAR

## 2018-10-25 MED ORDER — LIDOCAINE HCL (PF) 1 % IJ SOLN
INTRAOCULAR | Status: DC | PRN
  Administered 2018-10-25: 1 mL via OPHTHALMIC

## 2018-10-25 MED ORDER — BSS IO SOLN
INTRAOCULAR | Status: DC | PRN
  Administered 2018-10-25: 15 mL

## 2018-10-25 MED ORDER — EPINEPHRINE PF 1 MG/ML IJ SOLN
INTRAOCULAR | Status: DC | PRN
  Administered 2018-10-25: 500 mL

## 2018-10-25 MED ORDER — PHENYLEPHRINE HCL 2.5 % OP SOLN
1.0000 [drp] | OPHTHALMIC | Status: AC
  Administered 2018-10-25 (×3): 1 [drp] via OPHTHALMIC

## 2018-10-25 SURGICAL SUPPLY — 15 items
CLOTH BEACON ORANGE TIMEOUT ST (SAFETY) ×1 IMPLANT
EYE SHIELD UNIVERSAL CLEAR (GAUZE/BANDAGES/DRESSINGS) ×1 IMPLANT
GLOVE BIOGEL PI IND STRL 6.5 (GLOVE) IMPLANT
GLOVE BIOGEL PI IND STRL 7.0 (GLOVE) IMPLANT
GLOVE BIOGEL PI INDICATOR 6.5 (GLOVE) ×1
GLOVE BIOGEL PI INDICATOR 7.0 (GLOVE) ×1
LENS ALC ACRYL/TECN (Ophthalmic Related) ×1 IMPLANT
NDL HYPO 18GX1.5 BLUNT FILL (NEEDLE) IMPLANT
NEEDLE HYPO 18GX1.5 BLUNT FILL (NEEDLE) ×2 IMPLANT
PAD ARMBOARD 7.5X6 YLW CONV (MISCELLANEOUS) ×1 IMPLANT
SYR TB 1ML LL NO SAFETY (SYRINGE) ×1 IMPLANT
TAPE SURG TRANSPORE 1 IN (GAUZE/BANDAGES/DRESSINGS) IMPLANT
TAPE SURGICAL TRANSPORE 1 IN (GAUZE/BANDAGES/DRESSINGS) ×1
VISCOELASTIC ADDITIONAL (OPHTHALMIC RELATED) ×1 IMPLANT
WATER STERILE IRR 250ML POUR (IV SOLUTION) ×1 IMPLANT

## 2018-10-25 NOTE — H&P (Signed)
The H and P was reviewed and updated. The patient was examined.  No changes were found after exam.  The surgical eye was marked.  

## 2018-10-25 NOTE — Op Note (Signed)
Date of procedure: 10/25/18  Pre-operative diagnosis: Visually significant age-related cataract, Left Eye (H25.812)  Post-operative diagnosis: Visually significant age-related cataract, Left Eye  Procedure: Removal of cataract via phacoemulsification and insertion of intra-ocular lens [employer] PCB00  +20.0D into the capsular bag of the Left Eye  Attending surgeon: Gerda Diss. Vinny Taranto, MD, MA  Anesthesia: MAC, Topical [place]  Complications: None  Estimated Blood Loss: <27m (minimal)  Specimens: None  Implants: As above  Indications:  Visually significant age-related cataract, Left Eye  Procedure:  The patient was seen and identified in the pre-operative area. The operative eye was identified and dilated.  The operative eye was marked.  Topical anesthesia was administered to the operative eye.     The patient was then to the operative suite and placed in the supine position.  A timeout was performed confirming the patient, procedure to be performed, and all other relevant information.   The patient's face was prepped and draped in the usual fashion for intra-ocular surgery.  A lid speculum was placed into the operative eye and the surgical microscope moved into place and focused.  An inferotemporal paracentesis was created using a 20 gauge paracentesis blade.  Shugarcaine was injected into the anterior chamber.  Viscoelastic was injected into the anterior chamber.  A temporal clear-corneal main wound incision was created using a 2.461mmicrokeratome.  A continuous curvilinear capsulorrhexis was initiated using an irrigating cystitome and completed using capsulorrhexis forceps.  Hydrodissection and hydrodeliniation were performed.  Viscoelastic was injected into the anterior chamber.  A phacoemulsification handpiece and a chopper as a second instrument were used to remove the nucleus and epinucleus. The irrigation/aspiration handpiece was used to remove any remaining cortical  material.   The capsular bag was reinflated with viscoelastic, checked, and found to be intact.  The intraocular lens was inserted into the capsular bag and dialed into place using a Kuglen hook.  The irrigation/aspiration handpiece was used to remove any remaining viscoelastic.  The clear corneal wound and paracentesis wounds were then hydrated and checked with Weck-Cels to be watertight.  The lid-speculum and drape was removed, and the patient's face was cleaned with a wet and dry 4x4.  Maxitrol was instilled in the eye before a clear shield was taped over the eye. The patient was taken to the post-operative care unit in good condition, having tolerated the procedure well.  Post-Op Instructions: [facility] and will receive all other orders and instructions.

## 2018-10-25 NOTE — Anesthesia Postprocedure Evaluation (Signed)
Anesthesia Post Note  Patient: Kathy Jennings  Procedure(s) Performed: CATARACT EXTRACTION PHACO AND INTRAOCULAR LENS PLACEMENT ([place]) (Left Eye)  Patient location during evaluation: Short Stay Anesthesia Type: MAC Level of consciousness: awake and alert Pain management: pain level controlled Vital Signs Assessment: post-procedure vital signs reviewed and stable Respiratory status: spontaneous breathing Cardiovascular status: stable and blood pressure returned to baseline Postop Assessment: no apparent nausea or vomiting Anesthetic complications: no     Last Vitals:  Vitals:   10/25/18 1142  BP: (!) 153/70  Pulse: (!) 48  Resp: 16  Temp: 36.7 C    Last Pain:  Vitals:   10/25/18 1142  TempSrc: Oral                 Anntonette Madewell

## 2018-10-25 NOTE — Transfer of Care (Signed)
Immediate Anesthesia Transfer of Care Note  Patient: Kathy Jennings  Procedure(s) Performed: CATARACT EXTRACTION PHACO AND INTRAOCULAR LENS PLACEMENT ([facility]) (Left Eye)  Patient Location: Short Stay  Anesthesia Type:MAC  Level of Consciousness: awake  Airway & Oxygen Therapy: Patient Spontanous Breathing  Post-op Assessment: Report given to RN  Post vital signs: Reviewed  Last Vitals:  Vitals Value Taken Time  BP    Temp    Pulse    Resp    SpO2      Last Pain:  Vitals:   10/25/18 1142  TempSrc: Oral         Complications: No apparent anesthesia complications

## 2018-10-25 NOTE — Anesthesia Preprocedure Evaluation (Signed)
Anesthesia Evaluation  Patient identified by MRN, date of birth, ID band Patient awake    Reviewed: Allergy & Precautions, NPO status , Patient's Chart, lab work & pertinent test results  History of Anesthesia Complications (+) PONV and history of anesthetic complications  Airway Mallampati: II       Dental  (+) Upper Dentures, Teeth Intact   Pulmonary neg pulmonary ROS,    breath sounds clear to auscultation       Cardiovascular + Peripheral Vascular Disease  negative cardio ROS   Rhythm:regular     Neuro/Psych negative neurological ROS  negative psych ROS   GI/Hepatic negative GI ROS, Neg liver ROS, GERD  ,  Endo/Other  negative endocrine ROSHypothyroidism   Renal/GU negative Renal ROS  negative genitourinary   Musculoskeletal negative musculoskeletal ROS (+) Arthritis ,   Abdominal   Peds negative pediatric ROS (+)  Hematology negative hematology ROS (+) Blood dyscrasia, anemia ,   Anesthesia Other Findings Dose pak in progress for lower back pain  Reproductive/Obstetrics negative OB ROS                             Anesthesia Physical  Anesthesia Plan  ASA: II  Anesthesia Plan: MAC   Post-op Pain Management:    Induction:   PONV Risk Score and Plan:   Airway Management Planned:   Additional Equipment:   Intra-op Plan:   Post-operative Plan:   Informed Consent: I have reviewed the patients History and Physical, chart, labs and discussed the procedure including the risks, benefits and alternatives for the proposed anesthesia with the patient or authorized representative who has indicated his/her understanding and acceptance.       Plan Discussed with: Anesthesiologist  Anesthesia Plan Comments:         Anesthesia Quick Evaluation

## 2018-10-25 NOTE — Discharge Instructions (Signed)
Please discharge patient when stable, will follow up today with Dr. Sherrill Mckamie at the [facility] office immediately following discharge.  Leave shield in place until visit.  All paperwork with discharge instructions will be given at the office. ° °

## 2018-10-26 ENCOUNTER — Encounter (HOSPITAL_COMMUNITY): Payer: Self-pay | Admitting: Ophthalmology

## 2018-10-28 ENCOUNTER — Emergency Department (HOSPITAL_COMMUNITY)
Admission: EM | Admit: 2018-10-28 | Discharge: 2018-10-28 | Disposition: A | Discharge status: Home / Self Care | Payer: Medicare HMO | Attending: Emergency Medicine | Admitting: Emergency Medicine

## 2018-10-28 ENCOUNTER — Other Ambulatory Visit: Payer: Self-pay

## 2018-10-28 ENCOUNTER — Encounter (HOSPITAL_COMMUNITY): Payer: Self-pay

## 2018-10-28 DIAGNOSIS — Z79899 Other long term (current) drug therapy: Secondary | ICD-10-CM | POA: Insufficient documentation

## 2018-10-28 DIAGNOSIS — R040 Epistaxis: Secondary | ICD-10-CM | POA: Diagnosis not present

## 2018-10-28 DIAGNOSIS — Z7982 Long term (current) use of aspirin: Secondary | ICD-10-CM | POA: Diagnosis not present

## 2018-10-28 DIAGNOSIS — M1990 Unspecified osteoarthritis, unspecified site: Secondary | ICD-10-CM | POA: Diagnosis not present

## 2018-10-28 DIAGNOSIS — K219 Gastro-esophageal reflux disease without esophagitis: Secondary | ICD-10-CM | POA: Diagnosis not present

## 2018-10-28 MED ORDER — OXYMETAZOLINE HCL 0.05 % NA SOLN
3.0000 | Freq: Once | NASAL | Status: AC
  Administered 2018-10-28: 3 via NASAL
  Filled 2018-10-28: qty 30

## 2018-10-28 NOTE — ED Provider Notes (Signed)
[facility] EMERGENCY DEPARTMENT Provider Note   CSN: [id number] Arrival date & time: 10/28/18  1114    History   Chief Complaint Chief Complaint  Patient presents with  . Epistaxis    HPI Kathy Jennings is a 68 y.o. female.  Who presents emergency department chief complaint of epistaxis.  Patient states that last night just before she went to bed she was urinating and had onset of epistaxis.  She has never had a nosebleed in the past.  She states that it was "bleeding like a faucet."  She had blood and clots on the back of her throat.  The patient went to [facility] health [facility] and was seen by provider and had Merisel packing placed in both nares.  The patient also states that he had great difficulty putting in larger nasal packing.  She was told to follow-up with ENT.  Patient states that this morning she noticed bleeding on the gauze that was covering her nasal packing and presented here to the emergency department.  Patient states that she was very anxious that she would die and sleep because she would stop breathing.  She notes that she was given a medication that ends in PAM by the [facility] provider for her anxiety.  Patient does not take any blood thinning medications.  She denies any current bleeding or oozing in the back of her throat.     HPI  Past Medical History:  Diagnosis Date  . Arthritis   . Complication of anesthesia    slow to wake up  . GERD (gastroesophageal reflux disease)   . OAB (overactive bladder)   . Peripheral vascular disease (HCC)   . PONV (postoperative nausea and vomiting)   . Urgency of urination   . Varicose veins     Patient Active Problem List   Diagnosis Date Noted  . Subclinical hypothyroidism 09/23/2018  . Therapeutic drug monitoring   . 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 S/P right TKA 10/31/2014  . S/P knee replacement 10/31/2014  . Postoperative anemia due to acute blood loss 04/19/2014  . Obese 04/19/2014    . S/P right THA, AA 04/18/2014    Past Surgical History:  Procedure Laterality Date  . ABDOMINAL HYSTERECTOMY  30 YRS AGO  . CATARACT EXTRACTION W/PHACO Right 10/11/2018   Procedure: CATARACT EXTRACTION PHACO AND INTRAOCULAR LENS PLACEMENT RIGHT EYE (CDE: 5.67);  Surgeon: Fabio Pierce, MD;  Location: [facility];  Service: Ophthalmology;  Laterality: Right;  . CATARACT EXTRACTION W/PHACO Left 10/25/2018   Procedure: CATARACT EXTRACTION PHACO AND INTRAOCULAR LENS PLACEMENT ([facility]);  Surgeon: Fabio Pierce, MD;  Location: [facility];  Service: Ophthalmology;  Laterality: Left;  CDE: 2.69  . COLONOSCOPY  2005  . I&D KNEE WITH POLY EXCHANGE Right 03/31/2017   Procedure: RIGHT KNEE IRRIGATION AND DEBRIDEMENT WITH POLY EXCHANGE;  Surgeon: Durene Romans, MD;  Location: [facility];  Service: Orthopedics;  Laterality: Right;  120 mins  . TOTAL HIP ARTHROPLASTY Right 04/18/2014   Procedure: RIGHT TOTAL HIP ARTHROPLASTY ANTERIOR APPROACH;  Surgeon: Shelda Pal, MD;  Location: [facility];  Service: Orthopedics;  Laterality: Right;  . TOTAL KNEE ARTHROPLASTY Right 10/31/2014   Procedure: TOTAL RIGHT KNEE ARTHROPLASTY;  Surgeon: Shelda Pal, MD;  Location: [facility];  Service: Orthopedics;  Laterality: Right;     OB History   No obstetric history on file.      Home Medications    Prior to Admission medications   Medication Sig Start  Date End Date Taking? Authorizing Provider  aspirin (ASPIRIN 81) 81 MG EC tablet Take 81 mg by mouth daily.    Yes [provider]  Cholecalciferol (VITAMIN D3) 2000 units capsule Take 2,000 Units by mouth daily with lunch.   Yes [provider]  loratadine (CLARITIN) 10 MG tablet Take 10 mg by mouth daily with lunch.   Yes [provider]  Multiple Vitamin (MULTIVITAMIN WITH MINERALS) TABS tablet Take 1 tablet by mouth daily with lunch.   Yes [provider]  Omega-3 Fatty Acids (FISH OIL) 1200 MG CAPS Take 2,400 mg by mouth daily with lunch.   Yes  [provider]  omeprazole (PRILOSEC) 20 MG capsule Take 20 mg by mouth daily with lunch.    Yes [provider]  oxymetazoline (AFRIN) 0.05 % nasal spray Place 1 spray into both nostrils 2 (two) times daily as needed for congestion.   Yes [provider]    Family History Family History  Problem Relation Age of Onset  . Cancer Mother   . Cancer Father     Social History Social History   Tobacco Use  . Smoking status: Never Smoker  . Smokeless tobacco: Never Used  Substance Use Topics  . Alcohol use: No  . Drug use: No     Allergies   Sulfa antibiotics   Review of Systems Review of Systems Ten systems reviewed and are negative for acute change, except as noted in the HPI.    Physical Exam Updated Vital Signs BP (!) 142/69   Pulse (!) 55   Temp 98.3 F (36.8 C) (Oral)   Resp 20   SpO2 96%   Physical Exam Vitals signs and nursing note reviewed.  Constitutional:      General: She is not in acute distress.    Appearance: She is well-developed. She is not diaphoretic.  HENT:     Head: Normocephalic and atraumatic.     Nose:     Comments: Nasal packing removed.  Clotting noted along BL Septum No actvie bleeding    Mouth/Throat:     Comments: Dark, old appearing blood on the post oropharynx Eyes:     General: No scleral icterus.    Conjunctiva/sclera: Conjunctivae normal.  Neck:     Musculoskeletal: Normal range of motion.  Cardiovascular:     Rate and Rhythm: Normal rate and regular rhythm.     Heart sounds: Normal heart sounds. No murmur. No friction rub. No gallop.   Pulmonary:     Effort: Pulmonary effort is normal. No respiratory distress.     Breath sounds: Normal breath sounds.  Abdominal:     General: Bowel sounds are normal. There is no distension.     Palpations: Abdomen is soft. There is no mass.     Tenderness: There is no abdominal tenderness. There is no guarding.  Skin:    General: Skin is warm and dry.    Neurological:     Mental Status: She is alert and oriented to person, place, and time.  Psychiatric:        Behavior: Behavior normal.      ED Treatments / Results  Labs (all labs ordered are listed, but only abnormal results are displayed) Labs Reviewed - No data to display  EKG None  Radiology No results found.  Procedures Procedures (including critical care time)  Medications Ordered in ED Medications  oxymetazoline (AFRIN) 0.05 % nasal spray 3 spray (3 sprays Each Nare Given 10/28/18 1204)  Initial Impression / Assessment and Plan / ED Course  I have reviewed the triage vital signs and the nursing notes.  Pertinent labs & imaging results that were available during my care of the patient were reviewed by me and considered in my medical decision making (see chart for details).    Patient here with epistaxis.  Packing removed.  She was observed in the emergency department after giving Afrin in both nares.  She had no return of her epistaxis episodes.  Patient will be discharged with detailed instruction on hemostasis and epistaxis management at home as well as return precautions and nasal hygiene.  She is advised to follow with ear nose and throat.  She was appropriate for discharge at this time    Final Clinical Impressions(s) / ED Diagnoses   Final diagnoses:  Epistaxis    ED Discharge Orders    None       Arthor Captain, PA-C 10/28/18 2200    Azalia Bilis, MD 10/29/18 [phone]

## 2018-10-28 NOTE — ED Triage Notes (Signed)
Pt coming from home with c/o nose bleed. Was seen at another facility and had packing placed this morning but blood is still leaking through dressing. Pt alert and oriented.

## 2018-10-28 NOTE — Discharge Instructions (Addendum)
If you begin to have bleeding again follow the following steps: 1) spray 1-2 sprays of afrin in both nostrils and hold firm pressures on the soft part nose.  2) you may soak small size tampons with the afrin and place them in both sides of your nose, then apply pressure for 20 min. If your bleeding does not subside, return to the ER.  YOU MUST FOLLOW UP WITH AN EAR NOSE AND THROAT DOCTOR FOR DETAILED ASSESSMENT.    Nasal Hygiene The nose has many positive effects on the air you breathe in that you may not be aware of. - Temperature regulation - Filtration and removal of particulate matter - Humidification - Defense against infections There are several things you can do to help keep your nose healthy. Foremost is nasal hygiene. This will help with your nose's natural function and keep it moist and healthy. Techniques to accomplish this are outlined below. These will help with nasal dryness, nasal crusting, and nose bleeds. They also assist with clearing thick mucus that cause you to blow your nose frequently and may be associated with thick postnasal drip.  1. Use nasal saline daily. You can buy small bottles of this over-the-counter at the drug store or grocery store. Some brand names are: [address], [employer], [place]. Apply 2-3 sprays each nostril several times a day. If your nose feels dry or have had recent nasal surgery, try to use it every couple of hours. There is no medicine in it so it can be used as often as you like. Do NOT use the sprays containing decongestants. The appropriate way to apply nasal sprays: Place the nozzle just inside your nostril and point it towards the corner of your eye. Often, it is helpful to use the right-hand to spray into the left nasal cavity and use the left-hand to spray into the right side.  2. Use nasal saline irrigations and flushing.SinuCleanse, Simply Saline, [place]. Use this 1-2 times a day. We can also provide you with a recipe to use at home.  Just ask [place]. To prevent reintroducing bacteria back into your nose, please keep your irrigation equipment clean and dry between uses. Throw away and replace reusable irrigation equipment every 3 weeks.   3. Use Vaseline petroleum jelly or Aquaphor. You can apply this gently to each nostril 2-3 times a day to promote moisturization for your nose. You may also use triple antibiotic ointment such as Neosporin or Bacitracin. These can all be bought over-the-counter.  4. Consider other nasal emollients. A few preparations are available over-thecounter. Ponaris, Nose Better, Pretz. Ask your pharmacist what is available. Also, some nasal saline sprays have additives such as aloe and these are helpful.  5. Consider using a humidifier at home. If your nose feels dry and/or you have frequent nose bleeds, you can buy a humidifier for your home. Be cautious in using these if you have mold allergies.  6. Avoid excessive manual manipulation of your nose and nostrils. Frequent rubbing of your nostrils and the passing of tissues or fingers in your nostrils may aggravate nasal irritation from dryness and nose bleeds.

## 2018-10-28 NOTE — ED Notes (Signed)
Patient verbalizes understanding of discharge instructions. Opportunity for questioning and answers were provided. 

## 2018-11-02 ENCOUNTER — Other Ambulatory Visit (HOSPITAL_COMMUNITY): Payer: Self-pay | Admitting: Family Medicine

## 2018-11-02 DIAGNOSIS — M545 Low back pain, unspecified: Secondary | ICD-10-CM

## 2018-11-04 ENCOUNTER — Ambulatory Visit (HOSPITAL_COMMUNITY)
Admission: RE | Admit: 2018-11-04 | Discharge: 2018-11-04 | Disposition: A | Discharge status: Home / Self Care | Payer: Medicare HMO | Source: Ambulatory Visit | Attending: Family Medicine | Admitting: Family Medicine

## 2018-11-04 DIAGNOSIS — M545 Low back pain, unspecified: Secondary | ICD-10-CM

## 2018-11-15 DIAGNOSIS — J3489 Other specified disorders of nose and nasal sinuses: Secondary | ICD-10-CM | POA: Diagnosis not present

## 2018-11-15 DIAGNOSIS — R040 Epistaxis: Secondary | ICD-10-CM | POA: Diagnosis not present

## 2018-12-15 DIAGNOSIS — M47816 Spondylosis without myelopathy or radiculopathy, lumbar region: Secondary | ICD-10-CM | POA: Diagnosis not present

## 2018-12-15 DIAGNOSIS — M8448XA Pathological fracture, other site, initial encounter for fracture: Secondary | ICD-10-CM | POA: Diagnosis not present

## 2019-01-12 DIAGNOSIS — R69 Illness, unspecified: Secondary | ICD-10-CM | POA: Diagnosis not present

## 2019-02-28 ENCOUNTER — Telehealth: Payer: Self-pay | Admitting: "Endocrinology

## 2019-03-14 DIAGNOSIS — M85852 Other specified disorders of bone density and structure, left thigh: Secondary | ICD-10-CM | POA: Diagnosis not present

## 2019-03-14 DIAGNOSIS — M810 Age-related osteoporosis without current pathological fracture: Secondary | ICD-10-CM | POA: Diagnosis not present

## 2019-03-14 NOTE — Telephone Encounter (Signed)
error 

## 2019-03-18 DIAGNOSIS — E039 Hypothyroidism, unspecified: Secondary | ICD-10-CM | POA: Diagnosis not present

## 2019-03-18 LAB — TSH: TSH: 3.9 (ref 0.41–5.90)

## 2019-03-24 ENCOUNTER — Encounter (INDEPENDENT_AMBULATORY_CARE_PROVIDER_SITE_OTHER): Payer: Self-pay

## 2019-03-24 ENCOUNTER — Other Ambulatory Visit: Payer: Self-pay

## 2019-03-24 ENCOUNTER — Ambulatory Visit (INDEPENDENT_AMBULATORY_CARE_PROVIDER_SITE_OTHER): Payer: Medicare HMO | Admitting: "Endocrinology

## 2019-03-24 ENCOUNTER — Encounter: Payer: Self-pay | Admitting: "Endocrinology

## 2019-03-24 VITALS — BP 146/82 | HR 51 | Ht 62.0 in | Wt 203.0 lb

## 2019-03-24 DIAGNOSIS — E038 Other specified hypothyroidism: Secondary | ICD-10-CM

## 2019-03-24 DIAGNOSIS — E039 Hypothyroidism, unspecified: Secondary | ICD-10-CM

## 2019-03-24 NOTE — Progress Notes (Signed)
03/24/2019  Endocrinology follow-up note  Subjective:    Patient ID: Kathy Jennings, female    DOB: 05/24/1951, PCP Sasser, Clarene CritchleyPaul W, MD.   Past Medical History:  Diagnosis Date  . Arthritis   . Complication of anesthesia    slow to wake up  . GERD (gastroesophageal reflux disease)   . OAB (overactive bladder)   . Peripheral vascular disease (HCC)   . PONV (postoperative nausea and vomiting)   . Urgency of urination   . Varicose veins    Past Surgical History:  Procedure Laterality Date  . ABDOMINAL HYSTERECTOMY  30 YRS AGO  . CATARACT EXTRACTION W/PHACO Right 10/11/2018   Procedure: CATARACT EXTRACTION PHACO AND INTRAOCULAR LENS PLACEMENT RIGHT EYE (CDE: 5.67);  Surgeon: Fabio PierceWrzosek, James, MD;  Location: [facility];  Service: Ophthalmology;  Laterality: Right;  . CATARACT EXTRACTION W/PHACO Left 10/25/2018   Procedure: CATARACT EXTRACTION PHACO AND INTRAOCULAR LENS PLACEMENT (IOC);  Surgeon: Fabio PierceWrzosek, James, MD;  Location: [facility];  Service: Ophthalmology;  Laterality: Left;  CDE: 2.69  . COLONOSCOPY  2005  . I&D KNEE WITH POLY EXCHANGE Right 03/31/2017   Procedure: RIGHT KNEE IRRIGATION AND DEBRIDEMENT WITH POLY EXCHANGE;  Surgeon: Durene Romanslin, Matthew, MD;  Location: [facility];  Service: Orthopedics;  Laterality: Right;  120 mins  . TOTAL HIP ARTHROPLASTY Right 04/18/2014   Procedure: RIGHT TOTAL HIP ARTHROPLASTY ANTERIOR APPROACH;  Surgeon: Shelda PalMatthew D Olin, MD;  Location: [facility];  Service: Orthopedics;  Laterality: Right;  . TOTAL KNEE ARTHROPLASTY Right 10/31/2014   Procedure: TOTAL RIGHT KNEE ARTHROPLASTY;  Surgeon: Shelda PalMatthew D Olin, MD;  Location: [facility];  Service: Orthopedics;  Laterality: Right;   Social History   Socioeconomic History  . Marital status: Married    Spouse name: Not on file  . Number of children: Not on file  . Years of education: Not on file  . Highest education level: Not on file  Occupational History  . Not on file  Social Needs  . Financial resource strain: Not on  file  . Food insecurity    Worry: Not on file    Inability: Not on file  . Transportation needs    Medical: Not on file    Non-medical: Not on file  Tobacco Use  . Smoking status: Never Smoker  . Smokeless tobacco: Never Used  Substance and Sexual Activity  . Alcohol use: No  . Drug use: No  . Sexual activity: Not on file  Lifestyle  . Physical activity    Days per week: Not on file    Minutes per session: Not on file  . Stress: Not on file  Relationships  . Social Musicianconnections    Talks on phone: Not on file    Gets together: Not on file    Attends religious service: Not on file    Active member of club or organization: Not on file    Attends meetings of clubs or organizations: Not on file    Relationship status: Not on file  Other Topics Concern  . Not on file  Social History Narrative  . Not on file   Outpatient Encounter Medications as of 03/24/2019  Medication Sig  . aspirin (ASPIRIN 81) 81 MG EC tablet Take 81 mg by mouth daily.   . Cholecalciferol (VITAMIN D3) 2000 units capsule Take 2,000 Units by mouth daily with lunch.  . loratadine (CLARITIN) 10 MG tablet Take 10 mg by mouth daily with lunch.  . Multiple Vitamin (MULTIVITAMIN WITH MINERALS) TABS tablet  Take 1 tablet by mouth daily with lunch.  . Omega-3 Fatty Acids (FISH OIL) 1200 MG CAPS Take 2,400 mg by mouth daily with lunch.  Marland Kitchen omeprazole (PRILOSEC) 20 MG capsule Take 20 mg by mouth daily with lunch.   Marland Kitchen oxymetazoline (AFRIN) 0.05 % nasal spray Place 1 spray into both nostrils 2 (two) times daily as needed for congestion.   No facility-administered encounter medications on file as of 03/24/2019.     ALLERGIES: Allergies  Allergen Reactions  . Sulfa Antibiotics Nausea And Vomiting    VACCINATION STATUS: Immunization History  Administered Date(s) Administered  . Influenza,inj,Quad PF,6+ Mos 05/06/2017     HPI  Kathy Jennings is 68 y.o. female who presents today with a medical history as above.  she is here to f/u with repeat thyroid function tests after she was seen in consultation for subclinical hyperthyroidism in 2019.    PMD :Manon Hilding, MD.   Patient denies any prior history of thyroid dysfunction.  She is not on thyroid hormone supplement nor antithyroid treatment.   Her repeat previsit thyroid function tests are within normal limits.  -She lost 5 pounds since last visit intentionally.   she denies dysphagia, choking, shortness of breath, no recent voice change.    she denies family history of thyroid dysfunction, or any family history of thyroid malignancy.  she denies personal history of goiter.                            Review of systems  Constitutional:+ weight loss, + fatigue, - subjective hyperthermia Eyes: no blurry vision, - xerophthalmia ENT: no sore throat, no nodules palpated in throat, no dysphagia/odynophagia, nor hoarseness Cardiovascular: no Chest Pain, no Shortness of Breath, -  palpitations, no leg swelling  Gastrointestinal: no Nausea, no Vomiting, no Diarhhea Musculoskeletal: no muscle/joint aches Skin: no rashes Neurological: -  tremors, no numbness, no tingling, no dizziness Psychiatric: no depression, -  anxiety   Objective:    BP (!) 146/82   Pulse (!) 51   Ht 5\' 2"  (1.575 m)   Wt 203 lb (92.1 kg)   BMI 37.13 kg/m   Wt Readings from Last 3 Encounters:  03/24/19 203 lb (92.1 kg)  10/25/18 206 lb (93.4 kg)  09/23/18 208 lb (94.3 kg)                                                Physical exam  Constitutional:  not in acute distress, normal state of mind Eyes:  EOMI, no exophthalmos Neck: Supple Respiratory: Adequate breathing efforts Musculoskeletal: no gross deformities, strength intact in all four extremities Skin:  no rashes, no hyperemia Neurological: no tremor with outstretched hands.    CMP     Component Value Date/Time   NA 138 08/18/2017 0912   K 4.1 08/18/2017 0912   CL 104 08/18/2017 0912   CO2 26  08/18/2017 0912   GLUCOSE 100 (H) 08/18/2017 0912   BUN 17 09/14/2018   CREATININE 0.6 09/14/2018   CREATININE 0.51 08/18/2017 0912   CALCIUM 9.5 08/18/2017 0912   PROT 6.6 08/18/2017 0912   AST 17 08/18/2017 0912   ALT 30 (H) 08/18/2017 0912   BILITOT 0.5 08/18/2017 0912   GFRNONAA 102 08/18/2017 0912   GFRAA 118 08/18/2017 0912  CBC    Component Value Date/Time   WBC 2.8 (L) 08/18/2017 0912   RBC 4.29 08/18/2017 0912   HGB 11.3 (L) 08/18/2017 0912   HCT 34.5 (L) 08/18/2017 0912   PLT 263 08/18/2017 0912   MCV 80.4 08/18/2017 0912   MCH 26.3 (L) 08/18/2017 0912   MCHC 32.8 08/18/2017 0912   RDW 17.8 (H) 08/18/2017 0912   LYMPHSABS 1,002 08/18/2017 0912   MONOABS 0.5 05/11/2017 0815   EOSABS 0 (L) 08/18/2017 0912   BASOSABS 20 08/18/2017 0912    Lab Results  Component Value Date   TSH 3.90 03/18/2019   TSH 5.80 09/14/2018   TSH 0.03 (A) 07/08/2018     Assessment & Plan:   1.  Subclinical hypothyroidism-resolved -Her previsit thyroid function tests are consistent with euthyroid state.  She is on antithyroid nor thyroid hormone supplements.  -She will not intervention for now, but patient likely is at risk for hypothyroidism. She will return in 12 months with repeat labs. -She will have baseline thyroid/neck ultrasound before her next visit. -her labs rule out diabetes/prediabetes.  - I advised her to maintain close follow up with Sasser, Clarene CritchleyPaul W, MD for primary care needs.    Time for this visit: 15 minutes. Kathy Jennings  participated in the discussions, expressed understanding, and voiced agreement with the above plans.  All questions were answered to her satisfaction. she is encouraged to contact clinic should she have any questions or concerns prior to her return visit.   Follow up plan: Return in about 1 year (around 03/23/2020) for Follow up with Pre-visit Labs.   Thank you for involving me in the care of this pleasant patient, and I will continue  to update you with her progress.  Marquis LunchGebre Nida, MD [facility] Phone: [phone]  Fax: [phone]   03/24/2019, 6:11 PM  This note was partially dictated with voice recognition software. Similar sounding words can be transcribed inadequately or may not  be corrected upon review.

## 2019-04-01 DIAGNOSIS — E7800 Pure hypercholesterolemia, unspecified: Secondary | ICD-10-CM | POA: Diagnosis not present

## 2019-04-01 DIAGNOSIS — E039 Hypothyroidism, unspecified: Secondary | ICD-10-CM | POA: Diagnosis not present

## 2019-04-18 DIAGNOSIS — H40013 Open angle with borderline findings, low risk, bilateral: Secondary | ICD-10-CM | POA: Diagnosis not present

## 2019-04-18 DIAGNOSIS — Z961 Presence of intraocular lens: Secondary | ICD-10-CM | POA: Diagnosis not present

## 2019-04-18 DIAGNOSIS — H26493 Other secondary cataract, bilateral: Secondary | ICD-10-CM | POA: Diagnosis not present

## 2019-05-02 DIAGNOSIS — E7800 Pure hypercholesterolemia, unspecified: Secondary | ICD-10-CM | POA: Diagnosis not present

## 2019-05-02 DIAGNOSIS — E039 Hypothyroidism, unspecified: Secondary | ICD-10-CM | POA: Diagnosis not present

## 2019-05-16 DIAGNOSIS — Z23 Encounter for immunization: Secondary | ICD-10-CM | POA: Diagnosis not present

## 2019-05-30 DIAGNOSIS — Z0100 Encounter for examination of eyes and vision without abnormal findings: Secondary | ICD-10-CM | POA: Diagnosis not present

## 2019-05-30 DIAGNOSIS — R7301 Impaired fasting glucose: Secondary | ICD-10-CM | POA: Diagnosis not present

## 2019-05-30 DIAGNOSIS — K210 Gastro-esophageal reflux disease with esophagitis: Secondary | ICD-10-CM | POA: Diagnosis not present

## 2019-05-30 DIAGNOSIS — E0590 Thyrotoxicosis, unspecified without thyrotoxic crisis or storm: Secondary | ICD-10-CM | POA: Diagnosis not present

## 2019-05-30 DIAGNOSIS — E7800 Pure hypercholesterolemia, unspecified: Secondary | ICD-10-CM | POA: Diagnosis not present

## 2019-06-02 DIAGNOSIS — J309 Allergic rhinitis, unspecified: Secondary | ICD-10-CM | POA: Diagnosis not present

## 2019-06-02 DIAGNOSIS — Z0001 Encounter for general adult medical examination with abnormal findings: Secondary | ICD-10-CM | POA: Diagnosis not present

## 2019-06-02 DIAGNOSIS — K219 Gastro-esophageal reflux disease without esophagitis: Secondary | ICD-10-CM | POA: Diagnosis not present

## 2019-06-02 DIAGNOSIS — E7800 Pure hypercholesterolemia, unspecified: Secondary | ICD-10-CM | POA: Diagnosis not present

## 2019-06-02 DIAGNOSIS — E039 Hypothyroidism, unspecified: Secondary | ICD-10-CM | POA: Diagnosis not present

## 2019-06-02 DIAGNOSIS — R32 Unspecified urinary incontinence: Secondary | ICD-10-CM | POA: Diagnosis not present

## 2019-06-02 DIAGNOSIS — Z6837 Body mass index (BMI) 37.0-37.9, adult: Secondary | ICD-10-CM | POA: Diagnosis not present

## 2019-06-02 DIAGNOSIS — M1990 Unspecified osteoarthritis, unspecified site: Secondary | ICD-10-CM | POA: Diagnosis not present

## 2019-06-07 DIAGNOSIS — E0590 Thyrotoxicosis, unspecified without thyrotoxic crisis or storm: Secondary | ICD-10-CM | POA: Diagnosis not present

## 2019-06-07 DIAGNOSIS — R7301 Impaired fasting glucose: Secondary | ICD-10-CM | POA: Diagnosis not present

## 2019-06-07 DIAGNOSIS — Z0001 Encounter for general adult medical examination with abnormal findings: Secondary | ICD-10-CM | POA: Diagnosis not present

## 2019-06-07 DIAGNOSIS — E039 Hypothyroidism, unspecified: Secondary | ICD-10-CM | POA: Diagnosis not present

## 2019-06-07 DIAGNOSIS — Z1212 Encounter for screening for malignant neoplasm of rectum: Secondary | ICD-10-CM | POA: Diagnosis not present

## 2019-06-07 DIAGNOSIS — E7800 Pure hypercholesterolemia, unspecified: Secondary | ICD-10-CM | POA: Diagnosis not present

## 2019-06-07 DIAGNOSIS — Z6836 Body mass index (BMI) 36.0-36.9, adult: Secondary | ICD-10-CM | POA: Diagnosis not present

## 2019-06-07 DIAGNOSIS — R32 Unspecified urinary incontinence: Secondary | ICD-10-CM | POA: Diagnosis not present

## 2019-06-28 DIAGNOSIS — M7062 Trochanteric bursitis, left hip: Secondary | ICD-10-CM | POA: Diagnosis not present

## 2019-06-28 DIAGNOSIS — M545 Low back pain: Secondary | ICD-10-CM | POA: Diagnosis not present

## 2019-07-01 DIAGNOSIS — K219 Gastro-esophageal reflux disease without esophagitis: Secondary | ICD-10-CM | POA: Diagnosis not present

## 2019-07-01 DIAGNOSIS — M1990 Unspecified osteoarthritis, unspecified site: Secondary | ICD-10-CM | POA: Diagnosis not present

## 2019-07-01 DIAGNOSIS — E7800 Pure hypercholesterolemia, unspecified: Secondary | ICD-10-CM | POA: Diagnosis not present

## 2019-07-05 DIAGNOSIS — M47816 Spondylosis without myelopathy or radiculopathy, lumbar region: Secondary | ICD-10-CM | POA: Diagnosis not present

## 2019-07-20 DIAGNOSIS — R69 Illness, unspecified: Secondary | ICD-10-CM | POA: Diagnosis not present

## 2019-07-21 DIAGNOSIS — Z1231 Encounter for screening mammogram for malignant neoplasm of breast: Secondary | ICD-10-CM | POA: Diagnosis not present

## 2019-07-26 DIAGNOSIS — M47816 Spondylosis without myelopathy or radiculopathy, lumbar region: Secondary | ICD-10-CM | POA: Diagnosis not present

## 2019-10-26 DIAGNOSIS — R030 Elevated blood-pressure reading, without diagnosis of hypertension: Secondary | ICD-10-CM | POA: Diagnosis not present

## 2019-10-26 DIAGNOSIS — J309 Allergic rhinitis, unspecified: Secondary | ICD-10-CM | POA: Diagnosis not present

## 2019-10-26 DIAGNOSIS — Z791 Long term (current) use of non-steroidal anti-inflammatories (NSAID): Secondary | ICD-10-CM | POA: Diagnosis not present

## 2019-10-26 DIAGNOSIS — R32 Unspecified urinary incontinence: Secondary | ICD-10-CM | POA: Diagnosis not present

## 2019-10-26 DIAGNOSIS — I739 Peripheral vascular disease, unspecified: Secondary | ICD-10-CM | POA: Diagnosis not present

## 2019-10-26 DIAGNOSIS — M1990 Unspecified osteoarthritis, unspecified site: Secondary | ICD-10-CM | POA: Diagnosis not present

## 2019-10-26 DIAGNOSIS — K219 Gastro-esophageal reflux disease without esophagitis: Secondary | ICD-10-CM | POA: Diagnosis not present

## 2019-10-26 DIAGNOSIS — Z008 Encounter for other general examination: Secondary | ICD-10-CM | POA: Diagnosis not present

## 2019-10-26 DIAGNOSIS — Z79899 Other long term (current) drug therapy: Secondary | ICD-10-CM | POA: Diagnosis not present

## 2019-10-26 DIAGNOSIS — M810 Age-related osteoporosis without current pathological fracture: Secondary | ICD-10-CM | POA: Diagnosis not present

## 2019-10-27 DIAGNOSIS — H40013 Open angle with borderline findings, low risk, bilateral: Secondary | ICD-10-CM | POA: Diagnosis not present

## 2019-10-27 DIAGNOSIS — H26493 Other secondary cataract, bilateral: Secondary | ICD-10-CM | POA: Diagnosis not present

## 2020-01-18 DIAGNOSIS — R69 Illness, unspecified: Secondary | ICD-10-CM | POA: Diagnosis not present

## 2020-02-15 ENCOUNTER — Other Ambulatory Visit: Payer: Self-pay

## 2020-02-15 DIAGNOSIS — E038 Other specified hypothyroidism: Secondary | ICD-10-CM

## 2020-03-20 DIAGNOSIS — A084 Viral intestinal infection, unspecified: Secondary | ICD-10-CM | POA: Diagnosis not present

## 2020-03-20 DIAGNOSIS — E039 Hypothyroidism, unspecified: Secondary | ICD-10-CM | POA: Diagnosis not present

## 2020-03-20 DIAGNOSIS — Z6837 Body mass index (BMI) 37.0-37.9, adult: Secondary | ICD-10-CM | POA: Diagnosis not present

## 2020-03-20 LAB — TSH: TSH: 4.49 (ref 0.41–5.90)

## 2020-03-22 ENCOUNTER — Other Ambulatory Visit: Payer: Self-pay

## 2020-03-22 ENCOUNTER — Ambulatory Visit (HOSPITAL_COMMUNITY)
Admission: RE | Admit: 2020-03-22 | Discharge: 2020-03-22 | Disposition: A | Discharge status: Home / Self Care | Payer: Medicare HMO | Source: Ambulatory Visit | Attending: "Endocrinology | Admitting: "Endocrinology

## 2020-03-22 DIAGNOSIS — E079 Disorder of thyroid, unspecified: Secondary | ICD-10-CM | POA: Diagnosis not present

## 2020-03-22 DIAGNOSIS — E042 Nontoxic multinodular goiter: Secondary | ICD-10-CM | POA: Diagnosis not present

## 2020-03-22 DIAGNOSIS — E039 Hypothyroidism, unspecified: Secondary | ICD-10-CM | POA: Insufficient documentation

## 2020-03-22 DIAGNOSIS — E038 Other specified hypothyroidism: Secondary | ICD-10-CM

## 2020-03-26 ENCOUNTER — Ambulatory Visit: Payer: Medicare HMO | Admitting: "Endocrinology

## 2020-03-26 ENCOUNTER — Encounter: Payer: Self-pay | Admitting: "Endocrinology

## 2020-03-26 ENCOUNTER — Other Ambulatory Visit: Payer: Self-pay

## 2020-03-26 VITALS — BP 134/70 | HR 56 | Ht 62.0 in | Wt 205.0 lb

## 2020-03-26 DIAGNOSIS — E049 Nontoxic goiter, unspecified: Secondary | ICD-10-CM

## 2020-03-26 NOTE — Progress Notes (Signed)
03/26/2020  Endocrinology follow-up note  Subjective:    Patient ID: DI Kathy Jennings, female    DOB: 1951/07/21, PCP Sasser, Clarene Critchley, MD.   Past Medical History:  Diagnosis Date   Arthritis    Complication of anesthesia    slow to wake up   GERD (gastroesophageal reflux disease)    OAB (overactive bladder)    Peripheral vascular disease (HCC)    PONV (postoperative nausea and vomiting)    Urgency of urination    Varicose veins    Past Surgical History:  Procedure Laterality Date   ABDOMINAL HYSTERECTOMY  30 YRS AGO   CATARACT EXTRACTION W/PHACO Right 10/11/2018   Procedure: CATARACT EXTRACTION PHACO AND INTRAOCULAR LENS PLACEMENT RIGHT EYE (CDE: 5.67);  Surgeon: Fabio Pierce, MD;  Location: [facility];  Service: Ophthalmology;  Laterality: Right;   CATARACT EXTRACTION W/PHACO Left 10/25/2018   Procedure: CATARACT EXTRACTION PHACO AND INTRAOCULAR LENS PLACEMENT (IOC);  Surgeon: Fabio Pierce, MD;  Location: [facility];  Service: Ophthalmology;  Laterality: Left;  CDE: 2.69   COLONOSCOPY  2005   I & D KNEE WITH POLY EXCHANGE Right 03/31/2017   Procedure: RIGHT KNEE IRRIGATION AND DEBRIDEMENT WITH POLY EXCHANGE;  Surgeon: Durene Romans, MD;  Location: [facility];  Service: Orthopedics;  Laterality: Right;  120 mins   TOTAL HIP ARTHROPLASTY Right 04/18/2014   Procedure: RIGHT TOTAL HIP ARTHROPLASTY ANTERIOR APPROACH;  Surgeon: Shelda Pal, MD;  Location: [facility];  Service: Orthopedics;  Laterality: Right;   TOTAL KNEE ARTHROPLASTY Right 10/31/2014   Procedure: TOTAL RIGHT KNEE ARTHROPLASTY;  Surgeon: Shelda Pal, MD;  Location: [facility];  Service: Orthopedics;  Laterality: Right;   Social History   Socioeconomic History   Marital status: Married    Spouse name: Not on file   Number of children: Not on file   Years of education: Not on file   Highest education level: Not on file  Occupational History   Not on file  Tobacco Use   Smoking status: Never Smoker    Smokeless tobacco: Never Used  Vaping Use   Vaping Use: Never used  Substance and Sexual Activity   Alcohol use: No   Drug use: No   Sexual activity: Not on file  Other Topics Concern   Not on file  Social History Narrative   Not on file   Social Determinants of Health   Financial Resource Strain:    Difficulty of Paying Living Expenses:   Food Insecurity:    Worried About Programme researcher, broadcasting/film/video in the Last Year:    Barista in the Last Year:   Transportation Needs:    Freight forwarder (Medical):    Lack of Transportation (Non-Medical):   Physical Activity:    Days of Exercise per Week:    Minutes of Exercise per Session:   Stress:    Feeling of Stress :   Social Connections:    Frequency of Communication with Friends and Family:    Frequency of Social Gatherings with Friends and Family:    Attends Religious Services:    Active Member of Clubs or Organizations:    Attends Banker Meetings:    Marital Status:    Outpatient Encounter Medications as of 03/26/2020  Medication Sig   calcium carbonate (CALCIUM 600) 600 MG TABS tablet Take 600 mg by mouth 2 (two) times daily with a meal.   cetirizine (ZYRTEC) 10 MG tablet Take 10 mg by mouth daily.  aspirin (ASPIRIN 81) 81 MG EC tablet Take 81 mg by mouth daily.    Cholecalciferol (VITAMIN D3) 2000 units capsule Take 2,000 Units by mouth daily with lunch.   Multiple Vitamin (MULTIVITAMIN WITH MINERALS) TABS tablet Take 1 tablet by mouth daily with lunch.   Omega-3 Fatty Acids (FISH OIL) 1200 MG CAPS Take 2,400 mg by mouth daily with lunch.   omeprazole (PRILOSEC) 20 MG capsule Take 20 mg by mouth daily with lunch.    oxymetazoline (AFRIN) 0.05 % nasal spray Place 1 spray into both nostrils 2 (two) times daily as needed for congestion.   [DISCONTINUED] loratadine (CLARITIN) 10 MG tablet Take 10 mg by mouth daily with lunch.   No facility-administered encounter medications on  file as of 03/26/2020.    ALLERGIES: Allergies  Allergen Reactions   Sulfa Antibiotics Nausea And Vomiting    VACCINATION STATUS: Immunization History  Administered Date(s) Administered   Influenza,inj,Quad PF,6+ Mos 05/06/2017     HPI  Kathy Jennings is 69 y.o. female who presents today with a medical history as above. she is here to f/u with repeat thyroid function tests and thyroid ultrasound after she was seen in consultation for subclinical hyperthyroidism in 2019.    PMD :Estanislado Pandy, MD.   Patient denies any prior history of thyroid dysfunction.  She is not on thyroid hormone supplement nor antithyroid treatment.   Her repeat previsit thyroid function tests are within normal limits.  Her thyroid ultrasound is significant for mild goiter with indiscrete 0.7 cm nodule in the left lobe.    -She has steady weight, no new complaints. she denies dysphagia, choking, shortness of breath, no recent voice change.    she denies family history of thyroid dysfunction, or any family history of thyroid malignancy.  she denies personal history of goiter.                            Review of systems  Constitutional:+  steadyweight, + fatigue, - subjective hyperthermia Eyes: no blurry vision, - xerophthalmia ENT: no sore throat, no nodules palpated in throat, no dysphagia/odynophagia, nor hoarseness Cardiovascular: no Chest Pain, no Shortness of Breath, -  palpitations, no leg swelling  Gastrointestinal: no Nausea, no Vomiting, no Diarhhea Musculoskeletal: no muscle/joint aches Skin: no rashes Neurological: -  tremors, no numbness, no tingling, no dizziness Psychiatric: no depression, -  anxiety   Objective:    BP (!) 134/70    Pulse 56    Ht 5\' 2"  (1.575 m)    Wt (!) 205 lb (93 kg)    BMI 37.49 kg/m   Wt Readings from Last 3 Encounters:  03/26/20 (!) 205 lb (93 kg)  03/24/19 203 lb (92.1 kg)  10/25/18 206 lb (93.4 kg)                                                 Physical exam  Constitutional:  not in acute distress, normal state of mind Eyes:  EOMI, no exophthalmos Neck: Supple Respiratory: Adequate breathing efforts Musculoskeletal: no gross deformities, strength intact in all four extremities Skin:  no rashes, no hyperemia Neurological: no tremor with outstretched hands.    CMP     Component Value Date/Time   NA 138 08/18/2017 0912   K 4.1 08/18/2017 0912   CL  104 08/18/2017 0912   CO2 26 08/18/2017 0912   GLUCOSE 100 (H) 08/18/2017 0912   BUN 17 09/14/2018 0000   CREATININE 0.6 09/14/2018 0000   CREATININE 0.51 08/18/2017 0912   CALCIUM 9.5 08/18/2017 0912   PROT 6.6 08/18/2017 0912   AST 17 08/18/2017 0912   ALT 30 (H) 08/18/2017 0912   BILITOT 0.5 08/18/2017 0912   GFRNONAA 102 08/18/2017 0912   GFRAA 118 08/18/2017 0912     CBC    Component Value Date/Time   WBC 2.8 (L) 08/18/2017 0912   RBC 4.29 08/18/2017 0912   HGB 11.3 (L) 08/18/2017 0912   HCT 34.5 (L) 08/18/2017 0912   PLT 263 08/18/2017 0912   MCV 80.4 08/18/2017 0912   MCH 26.3 (L) 08/18/2017 0912   MCHC 32.8 08/18/2017 0912   RDW 17.8 (H) 08/18/2017 0912   LYMPHSABS 1,002 08/18/2017 0912   MONOABS 0.5 05/11/2017 0815   EOSABS 0 (L) 08/18/2017 0912   BASOSABS 20 08/18/2017 0912    Lab Results  Component Value Date   TSH 4.49 03/20/2020   TSH 3.90 03/18/2019   TSH 5.80 09/14/2018   TSH 0.03 (A) 07/08/2018    Thyroid ultrasound on March 22, 2020 IMPRESSION: Right lobe measuring 4.9 cm, left lobe measuring 5.3 cm 1. Thyroid tissue is mild-to-moderately heterogeneous. 2. Prominent tissue along the posterior aspect of the right thyroid lobe probably represents normal parenchyma. Even if this is a nodule, it would be a TR 3 nodule and meet criteria for 1 year follow-up. Consider 1 year follow-up to ensure stability. 3. Small left thyroid nodule does not meet criteria for biopsy or dedicated follow-up.  Assessment & Plan:   1.  Nodular goiter -Her  previsit thyroid function tests are consistent with euthyroid state.  She is not on antithyroid nor thyroid hormone supplements.  -She will not intervention for now, but patient likely is at risk for hypothyroidism. She will return in 12 months with repeat labs.  Her thyroid ultrasound is significant for mild goiter with subcentimeter nodule on the  left lobe, and indiscrete area on the right lobe.  She will need repeat thyroid ultrasound before her next visit in 1 year.  - I advised her to maintain close follow up with Sasser, Clarene Critchley, MD for primary care needs.     - Time spent on this patient care encounter:  20 minutes of which 50% was spent in  counseling and the rest reviewing  her current and  previous labs / studies and medications  doses and developing a plan for long term care. [name]  participated in the discussions, expressed understanding, and voiced agreement with the above plans.  All questions were answered to her satisfaction. she is encouraged to contact clinic should she have any questions or concerns prior to her return visit.   Follow up plan: Return in about 1 year (around 03/26/2021) for F/U with Pre-visit Labs, Thyroid / Neck Ultrasound.   Thank you for involving me in the care of this pleasant patient, and I will continue to update you with her progress.  Marquis Lunch, MD [facility] Phone: [phone]  Fax: [phone]   03/26/2020, 6:56 PM  This note was partially dictated with voice recognition software. Similar sounding words can be transcribed inadequately or may not  be corrected upon review.

## 2020-04-25 DIAGNOSIS — H40013 Open angle with borderline findings, low risk, bilateral: Secondary | ICD-10-CM | POA: Diagnosis not present

## 2020-06-04 DIAGNOSIS — K219 Gastro-esophageal reflux disease without esophagitis: Secondary | ICD-10-CM | POA: Diagnosis not present

## 2020-06-04 DIAGNOSIS — K2100 Gastro-esophageal reflux disease with esophagitis, without bleeding: Secondary | ICD-10-CM | POA: Diagnosis not present

## 2020-06-04 DIAGNOSIS — Z0001 Encounter for general adult medical examination with abnormal findings: Secondary | ICD-10-CM | POA: Diagnosis not present

## 2020-06-04 DIAGNOSIS — R7301 Impaired fasting glucose: Secondary | ICD-10-CM | POA: Diagnosis not present

## 2020-06-04 DIAGNOSIS — E7800 Pure hypercholesterolemia, unspecified: Secondary | ICD-10-CM | POA: Diagnosis not present

## 2020-06-06 DIAGNOSIS — E7800 Pure hypercholesterolemia, unspecified: Secondary | ICD-10-CM | POA: Diagnosis not present

## 2020-06-06 DIAGNOSIS — R32 Unspecified urinary incontinence: Secondary | ICD-10-CM | POA: Diagnosis not present

## 2020-06-06 DIAGNOSIS — K219 Gastro-esophageal reflux disease without esophagitis: Secondary | ICD-10-CM | POA: Diagnosis not present

## 2020-06-06 DIAGNOSIS — E039 Hypothyroidism, unspecified: Secondary | ICD-10-CM | POA: Diagnosis not present

## 2020-06-06 DIAGNOSIS — Z23 Encounter for immunization: Secondary | ICD-10-CM | POA: Diagnosis not present

## 2020-06-06 DIAGNOSIS — Z6837 Body mass index (BMI) 37.0-37.9, adult: Secondary | ICD-10-CM | POA: Diagnosis not present

## 2020-06-06 DIAGNOSIS — Z0001 Encounter for general adult medical examination with abnormal findings: Secondary | ICD-10-CM | POA: Diagnosis not present

## 2020-06-06 DIAGNOSIS — R7301 Impaired fasting glucose: Secondary | ICD-10-CM | POA: Diagnosis not present

## 2020-06-26 DIAGNOSIS — Z23 Encounter for immunization: Secondary | ICD-10-CM | POA: Diagnosis not present

## 2020-07-31 DIAGNOSIS — R69 Illness, unspecified: Secondary | ICD-10-CM | POA: Diagnosis not present

## 2020-10-08 DIAGNOSIS — Z23 Encounter for immunization: Secondary | ICD-10-CM | POA: Diagnosis not present

## 2020-10-08 DIAGNOSIS — Z1231 Encounter for screening mammogram for malignant neoplasm of breast: Secondary | ICD-10-CM | POA: Diagnosis not present

## 2020-10-22 IMAGING — MR MR LUMBAR SPINE W/O CM
4 of 5 series · 15 of 48 positions shown · non-contrast
Comparison: CT Abdomen and Pelvis 10/18/2018.

CLINICAL DATA: 67-year-old female with low back, left groin and
right leg pain. Difficulty walking.

EXAM:
MRI LUMBAR SPINE WITHOUT CONTRAST
TECHNIQUE: Multiplanar, multisequence MR imaging of the lumbar spine was
performed. No intravenous contrast was administered.

[Series 3: T2 · sagittal · 4.0mm · 0.70mm/px · 6 of 17 slices shown (1 of 2)]
[im 1/17]
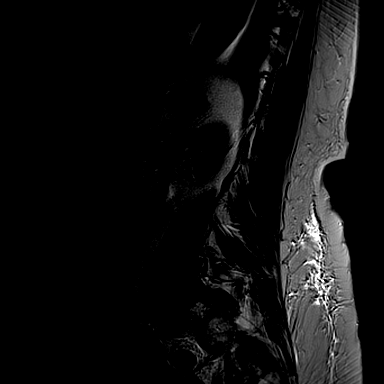
[im 4/17]
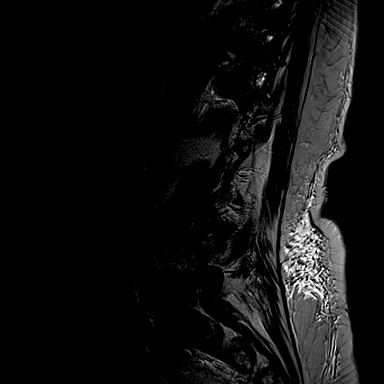
[im 7/17]
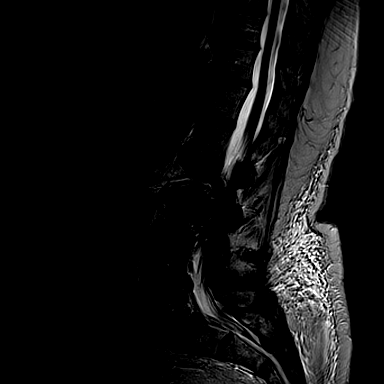
[im 10/17]
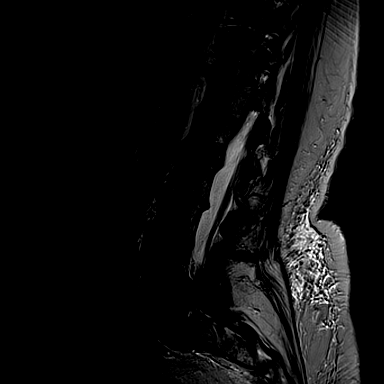
[im 13/17]
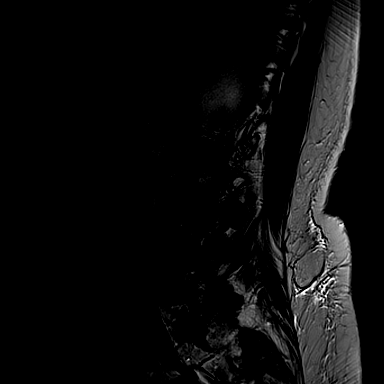
[im 17/17]
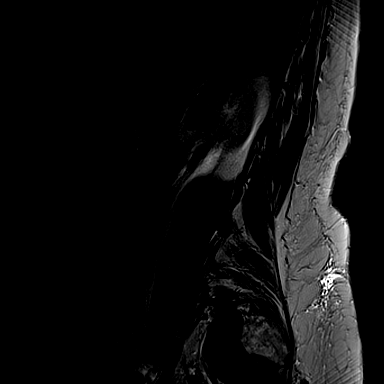

[Series 4: T1 · sagittal · 4.0mm · 0.35mm/px · 3 of 17 slices shown (1 of 2)]
[im 3/17]
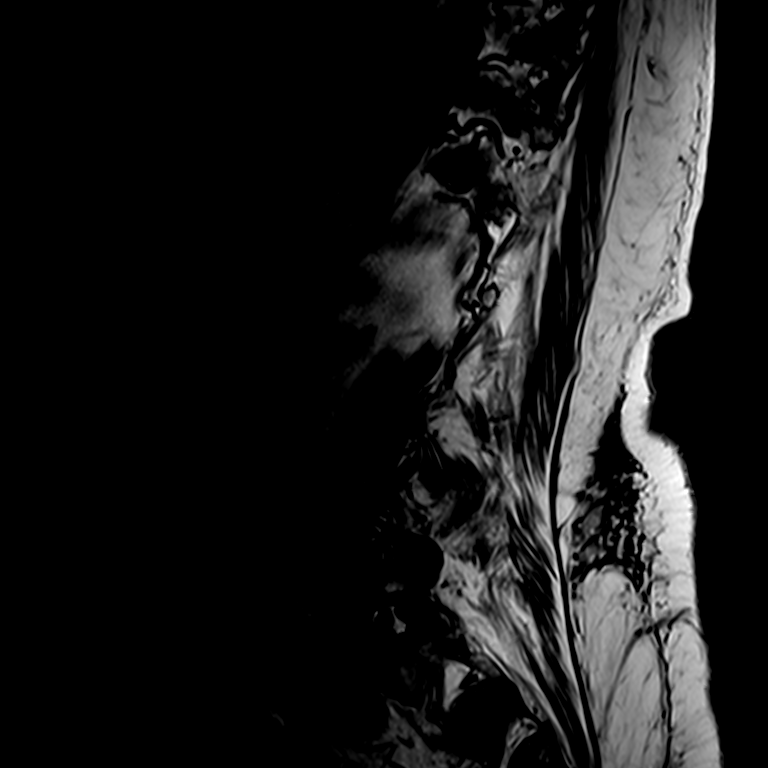
[im 9/17]
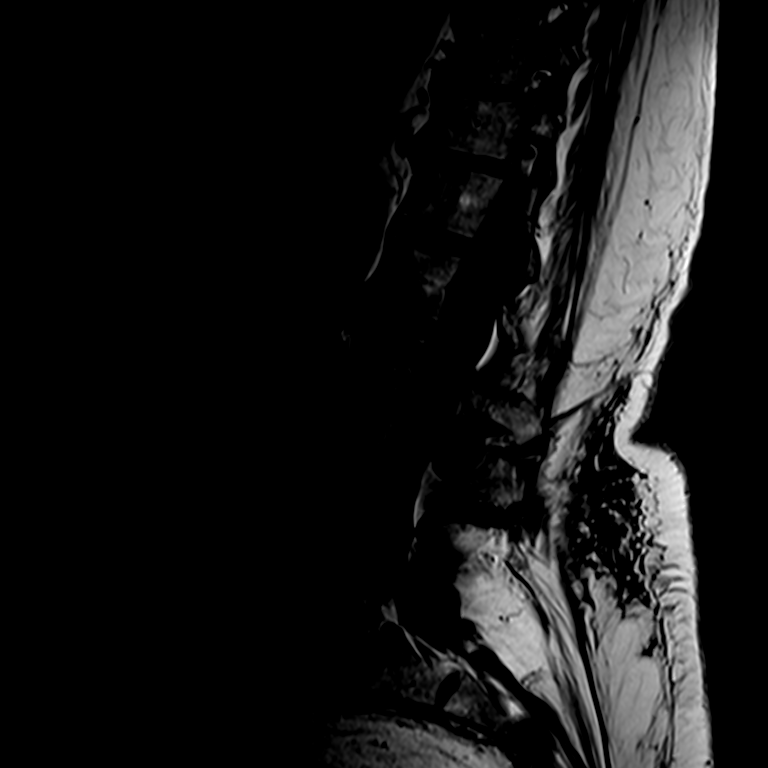
[im 14/17]
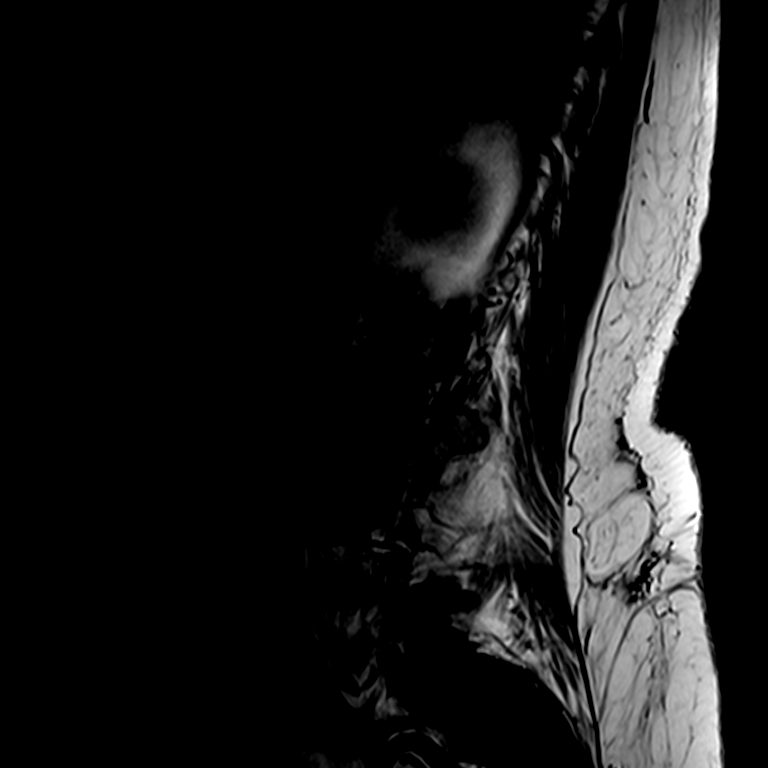

[Series 6: T2 · axial · 4.0mm · 0.25mm/px · z∈[-100,+18]mm · 3 of 35 slices shown (2 of 2)]
[im 6/35]
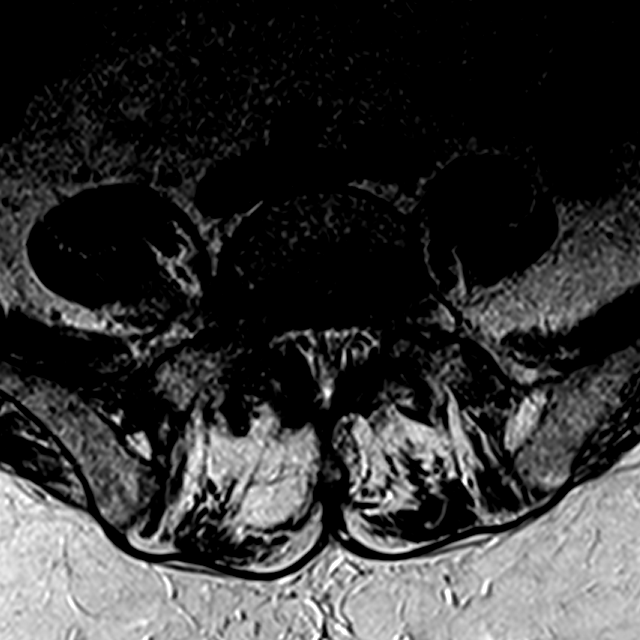
[im 19/35]
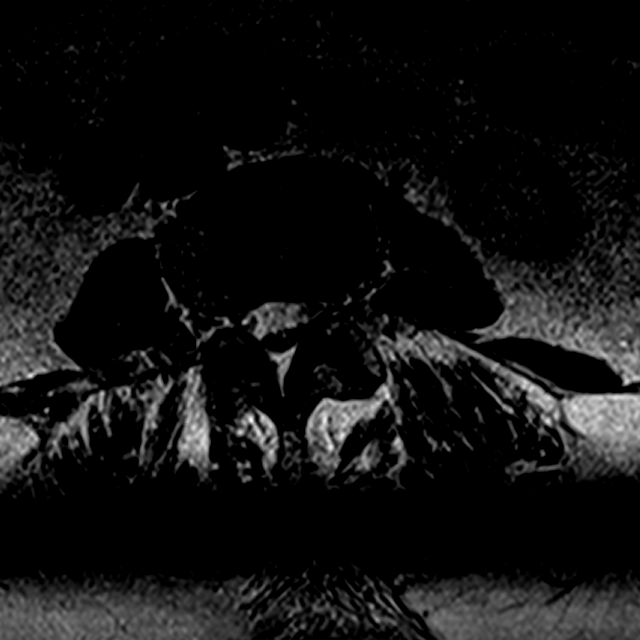
[im 29/35]
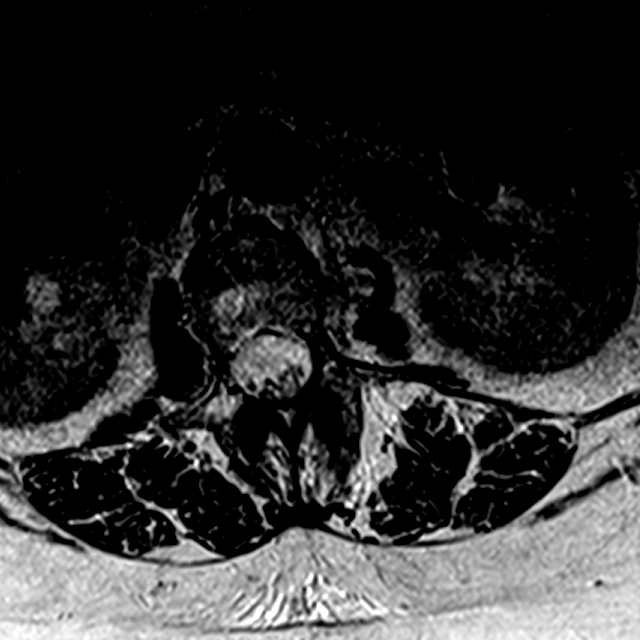

[Series 7: T1 · axial · 4.0mm · 0.25mm/px · z∈[-100,+18]mm · 3 of 35 slices shown (2 of 2)]
[im 6/35]
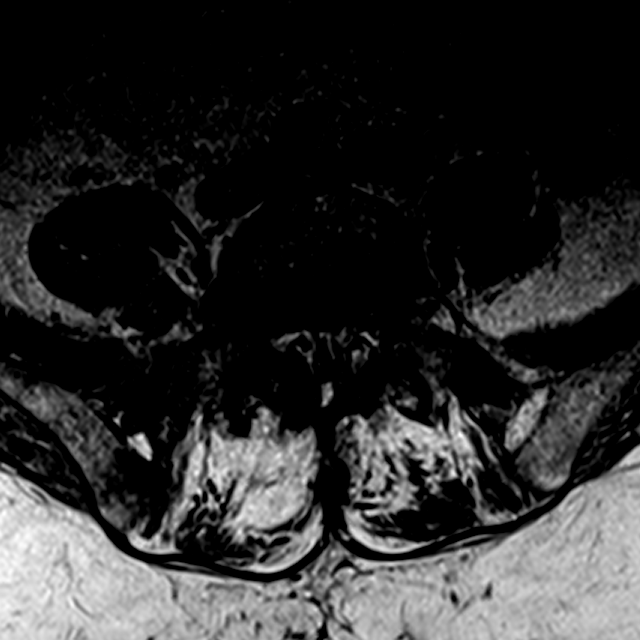
[im 19/35]
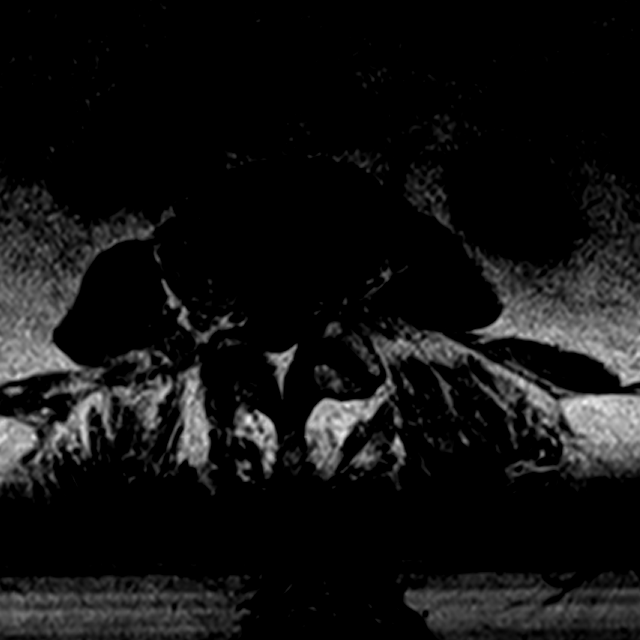
[im 29/35]
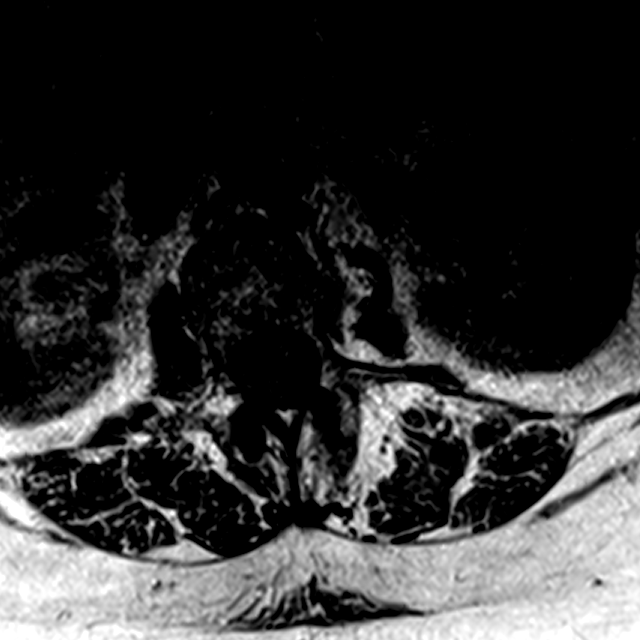

[15 of 48 positions shown; findings below may reference images not displayed]

FINDINGS: Segmentation:  Normal on the comparison.

Alignment: Moderate dextroconvex lumbar scoliosis with superimposed
mild grade 1 anterolisthesis at L5-S1. Stable vertebral height and
alignment since last month.

Vertebrae: Confluent abnormal marrow edema in both sacral ala
greater on the right (series 7, image 35 and also STIR imaging left
side series 5, image 16 and right side series 5, image 6). Subtle
sacral insufficiency fractures identified in retrospect on the prior
CT.

No lumbar or lower thoracic marrow edema or other acute osseous
abnormality.

Conus medullaris and cauda equina: Conus extends to the L1 level. No
lower spinal cord or conus signal abnormality.

Paraspinal and other soft tissues: Stable visible abdominal viscera.
Lower Thoracic and lumbar paraspinal soft tissues are within normal
limits. There is mild soft tissue edema adjacent to the sacral ala.

Disc levels:

Degenerative changes in the lower thoracic spine without spinal
stenosis.

Fairly capacious lumbar spinal canal.

Multifactorial mild bilateral lateral recess and moderate to severe
bilateral foraminal stenosis at L4-L5 (greater on the right).
Moderate to severe facet hypertrophy at L4-L5 and L5-S1. Moderate to
severe right L5 foraminal stenosis.
IMPRESSION: 1. Symptomatic abnormality appears to be subacute bilateral sacral
insufficiency fractures.
2. Superimposed moderate to severe lower lumbar degeneration and
neural foraminal stenosis in the setting of dextroconvex lumbar
scoliosis and grade 1 anterolisthesis at L5-S1.

## 2020-10-24 DIAGNOSIS — R921 Mammographic calcification found on diagnostic imaging of breast: Secondary | ICD-10-CM | POA: Diagnosis not present

## 2020-10-24 DIAGNOSIS — R928 Other abnormal and inconclusive findings on diagnostic imaging of breast: Secondary | ICD-10-CM | POA: Diagnosis not present

## 2020-11-09 DIAGNOSIS — M810 Age-related osteoporosis without current pathological fracture: Secondary | ICD-10-CM | POA: Diagnosis not present

## 2020-11-09 DIAGNOSIS — K219 Gastro-esophageal reflux disease without esophagitis: Secondary | ICD-10-CM | POA: Diagnosis not present

## 2020-11-09 DIAGNOSIS — R32 Unspecified urinary incontinence: Secondary | ICD-10-CM | POA: Diagnosis not present

## 2020-11-09 DIAGNOSIS — M1990 Unspecified osteoarthritis, unspecified site: Secondary | ICD-10-CM | POA: Diagnosis not present

## 2020-11-09 DIAGNOSIS — Z008 Encounter for other general examination: Secondary | ICD-10-CM | POA: Diagnosis not present

## 2020-11-09 DIAGNOSIS — G8929 Other chronic pain: Secondary | ICD-10-CM | POA: Diagnosis not present

## 2020-11-09 DIAGNOSIS — Z79899 Other long term (current) drug therapy: Secondary | ICD-10-CM | POA: Diagnosis not present

## 2020-11-09 DIAGNOSIS — Z6836 Body mass index (BMI) 36.0-36.9, adult: Secondary | ICD-10-CM | POA: Diagnosis not present

## 2020-11-09 DIAGNOSIS — J309 Allergic rhinitis, unspecified: Secondary | ICD-10-CM | POA: Diagnosis not present

## 2020-11-09 DIAGNOSIS — R030 Elevated blood-pressure reading, without diagnosis of hypertension: Secondary | ICD-10-CM | POA: Diagnosis not present

## 2020-11-22 DIAGNOSIS — R1313 Dysphagia, pharyngeal phase: Secondary | ICD-10-CM | POA: Diagnosis not present

## 2020-11-22 DIAGNOSIS — R040 Epistaxis: Secondary | ICD-10-CM | POA: Diagnosis not present

## 2020-11-22 DIAGNOSIS — J3489 Other specified disorders of nose and nasal sinuses: Secondary | ICD-10-CM | POA: Diagnosis not present

## 2021-03-26 ENCOUNTER — Ambulatory Visit: Payer: Medicare HMO | Admitting: "Endocrinology

## 2021-03-27 DIAGNOSIS — Z23 Encounter for immunization: Secondary | ICD-10-CM | POA: Diagnosis not present

## 2021-03-28 ENCOUNTER — Telehealth: Payer: Self-pay

## 2021-03-28 NOTE — Telephone Encounter (Signed)
Pt stated she wanted to cancel appointment, her PCP will manage her thyroid.

## 2021-03-28 NOTE — Telephone Encounter (Signed)
Patient is scheduled to see Nida next week, she never heard about having her ultrasound scheduled

## 2021-04-03 ENCOUNTER — Ambulatory Visit (HOSPITAL_COMMUNITY): Payer: Self-pay

## 2021-04-04 ENCOUNTER — Ambulatory Visit: Payer: Medicare HMO | Admitting: "Endocrinology

## 2021-04-24 DIAGNOSIS — R922 Inconclusive mammogram: Secondary | ICD-10-CM | POA: Diagnosis not present

## 2021-04-24 DIAGNOSIS — R921 Mammographic calcification found on diagnostic imaging of breast: Secondary | ICD-10-CM | POA: Diagnosis not present

## 2021-05-07 DIAGNOSIS — H40013 Open angle with borderline findings, low risk, bilateral: Secondary | ICD-10-CM | POA: Diagnosis not present

## 2021-05-07 DIAGNOSIS — Z961 Presence of intraocular lens: Secondary | ICD-10-CM | POA: Diagnosis not present

## 2021-05-07 DIAGNOSIS — H524 Presbyopia: Secondary | ICD-10-CM | POA: Diagnosis not present

## 2021-05-07 DIAGNOSIS — H26493 Other secondary cataract, bilateral: Secondary | ICD-10-CM | POA: Diagnosis not present

## 2021-05-15 DIAGNOSIS — Z23 Encounter for immunization: Secondary | ICD-10-CM | POA: Diagnosis not present

## 2021-06-10 DIAGNOSIS — K219 Gastro-esophageal reflux disease without esophagitis: Secondary | ICD-10-CM | POA: Diagnosis not present

## 2021-06-10 DIAGNOSIS — E7801 Familial hypercholesterolemia: Secondary | ICD-10-CM | POA: Diagnosis not present

## 2021-06-10 DIAGNOSIS — E0590 Thyrotoxicosis, unspecified without thyrotoxic crisis or storm: Secondary | ICD-10-CM | POA: Diagnosis not present

## 2021-06-10 DIAGNOSIS — R7301 Impaired fasting glucose: Secondary | ICD-10-CM | POA: Diagnosis not present

## 2021-06-10 DIAGNOSIS — E7800 Pure hypercholesterolemia, unspecified: Secondary | ICD-10-CM | POA: Diagnosis not present

## 2021-06-13 DIAGNOSIS — Z23 Encounter for immunization: Secondary | ICD-10-CM | POA: Diagnosis not present

## 2021-06-13 DIAGNOSIS — Z0001 Encounter for general adult medical examination with abnormal findings: Secondary | ICD-10-CM | POA: Diagnosis not present

## 2021-06-13 DIAGNOSIS — E6609 Other obesity due to excess calories: Secondary | ICD-10-CM | POA: Diagnosis not present

## 2021-06-13 DIAGNOSIS — K219 Gastro-esophageal reflux disease without esophagitis: Secondary | ICD-10-CM | POA: Diagnosis not present

## 2021-06-13 DIAGNOSIS — E7849 Other hyperlipidemia: Secondary | ICD-10-CM | POA: Diagnosis not present

## 2021-06-13 DIAGNOSIS — Z6837 Body mass index (BMI) 37.0-37.9, adult: Secondary | ICD-10-CM | POA: Diagnosis not present

## 2021-06-13 DIAGNOSIS — R7989 Other specified abnormal findings of blood chemistry: Secondary | ICD-10-CM | POA: Diagnosis not present

## 2021-07-11 DIAGNOSIS — H40013 Open angle with borderline findings, low risk, bilateral: Secondary | ICD-10-CM | POA: Diagnosis not present

## 2021-07-11 DIAGNOSIS — H26493 Other secondary cataract, bilateral: Secondary | ICD-10-CM | POA: Diagnosis not present

## 2021-07-26 DIAGNOSIS — Z20828 Contact with and (suspected) exposure to other viral communicable diseases: Secondary | ICD-10-CM | POA: Diagnosis not present

## 2021-07-26 DIAGNOSIS — R059 Cough, unspecified: Secondary | ICD-10-CM | POA: Diagnosis not present

## 2021-09-08 DIAGNOSIS — Z882 Allergy status to sulfonamides status: Secondary | ICD-10-CM | POA: Diagnosis not present

## 2021-09-08 DIAGNOSIS — R040 Epistaxis: Secondary | ICD-10-CM | POA: Diagnosis not present

## 2021-09-08 DIAGNOSIS — R42 Dizziness and giddiness: Secondary | ICD-10-CM | POA: Diagnosis not present

## 2021-09-08 DIAGNOSIS — R58 Hemorrhage, not elsewhere classified: Secondary | ICD-10-CM | POA: Diagnosis not present

## 2021-09-08 DIAGNOSIS — Z743 Need for continuous supervision: Secondary | ICD-10-CM | POA: Diagnosis not present

## 2021-09-26 DIAGNOSIS — R040 Epistaxis: Secondary | ICD-10-CM | POA: Diagnosis not present

## 2021-09-26 DIAGNOSIS — J3489 Other specified disorders of nose and nasal sinuses: Secondary | ICD-10-CM | POA: Diagnosis not present

## 2021-11-05 DIAGNOSIS — Z6836 Body mass index (BMI) 36.0-36.9, adult: Secondary | ICD-10-CM | POA: Diagnosis not present

## 2021-11-05 DIAGNOSIS — Z008 Encounter for other general examination: Secondary | ICD-10-CM | POA: Diagnosis not present

## 2021-11-05 DIAGNOSIS — Z8249 Family history of ischemic heart disease and other diseases of the circulatory system: Secondary | ICD-10-CM | POA: Diagnosis not present

## 2021-11-05 DIAGNOSIS — E669 Obesity, unspecified: Secondary | ICD-10-CM | POA: Diagnosis not present

## 2021-11-05 DIAGNOSIS — Z823 Family history of stroke: Secondary | ICD-10-CM | POA: Diagnosis not present

## 2021-11-05 DIAGNOSIS — R69 Illness, unspecified: Secondary | ICD-10-CM | POA: Diagnosis not present

## 2021-11-05 DIAGNOSIS — Z809 Family history of malignant neoplasm, unspecified: Secondary | ICD-10-CM | POA: Diagnosis not present

## 2021-11-05 DIAGNOSIS — Z96649 Presence of unspecified artificial hip joint: Secondary | ICD-10-CM | POA: Diagnosis not present

## 2021-11-05 DIAGNOSIS — R030 Elevated blood-pressure reading, without diagnosis of hypertension: Secondary | ICD-10-CM | POA: Diagnosis not present

## 2021-11-05 DIAGNOSIS — K219 Gastro-esophageal reflux disease without esophagitis: Secondary | ICD-10-CM | POA: Diagnosis not present

## 2021-11-05 DIAGNOSIS — R32 Unspecified urinary incontinence: Secondary | ICD-10-CM | POA: Diagnosis not present

## 2021-11-12 DIAGNOSIS — J3489 Other specified disorders of nose and nasal sinuses: Secondary | ICD-10-CM | POA: Diagnosis not present

## 2021-11-20 DIAGNOSIS — R922 Inconclusive mammogram: Secondary | ICD-10-CM | POA: Diagnosis not present

## 2021-11-20 DIAGNOSIS — R921 Mammographic calcification found on diagnostic imaging of breast: Secondary | ICD-10-CM | POA: Diagnosis not present

## 2022-05-29 DIAGNOSIS — Z6832 Body mass index (BMI) 32.0-32.9, adult: Secondary | ICD-10-CM | POA: Diagnosis not present

## 2022-05-29 DIAGNOSIS — Z23 Encounter for immunization: Secondary | ICD-10-CM | POA: Diagnosis not present

## 2022-06-10 DIAGNOSIS — M25562 Pain in left knee: Secondary | ICD-10-CM | POA: Diagnosis not present

## 2022-07-02 DIAGNOSIS — K2100 Gastro-esophageal reflux disease with esophagitis, without bleeding: Secondary | ICD-10-CM | POA: Diagnosis not present

## 2022-07-02 DIAGNOSIS — E7801 Familial hypercholesterolemia: Secondary | ICD-10-CM | POA: Diagnosis not present

## 2022-07-02 DIAGNOSIS — Z0001 Encounter for general adult medical examination with abnormal findings: Secondary | ICD-10-CM | POA: Diagnosis not present

## 2022-07-02 DIAGNOSIS — R7989 Other specified abnormal findings of blood chemistry: Secondary | ICD-10-CM | POA: Diagnosis not present

## 2022-07-02 DIAGNOSIS — R7301 Impaired fasting glucose: Secondary | ICD-10-CM | POA: Diagnosis not present

## 2022-07-02 DIAGNOSIS — E039 Hypothyroidism, unspecified: Secondary | ICD-10-CM | POA: Diagnosis not present

## 2022-07-02 DIAGNOSIS — E7849 Other hyperlipidemia: Secondary | ICD-10-CM | POA: Diagnosis not present

## 2022-07-09 DIAGNOSIS — Z1389 Encounter for screening for other disorder: Secondary | ICD-10-CM | POA: Diagnosis not present

## 2022-07-09 DIAGNOSIS — Z6835 Body mass index (BMI) 35.0-35.9, adult: Secondary | ICD-10-CM | POA: Diagnosis not present

## 2022-07-09 DIAGNOSIS — Z23 Encounter for immunization: Secondary | ICD-10-CM | POA: Diagnosis not present

## 2022-07-09 DIAGNOSIS — R030 Elevated blood-pressure reading, without diagnosis of hypertension: Secondary | ICD-10-CM | POA: Diagnosis not present

## 2022-07-09 DIAGNOSIS — Z1331 Encounter for screening for depression: Secondary | ICD-10-CM | POA: Diagnosis not present

## 2022-07-09 DIAGNOSIS — E7849 Other hyperlipidemia: Secondary | ICD-10-CM | POA: Diagnosis not present

## 2022-07-09 DIAGNOSIS — K219 Gastro-esophageal reflux disease without esophagitis: Secondary | ICD-10-CM | POA: Diagnosis not present

## 2022-07-09 DIAGNOSIS — E6609 Other obesity due to excess calories: Secondary | ICD-10-CM | POA: Diagnosis not present

## 2022-07-09 DIAGNOSIS — Z8739 Personal history of other diseases of the musculoskeletal system and connective tissue: Secondary | ICD-10-CM | POA: Diagnosis not present

## 2022-07-09 DIAGNOSIS — Z0001 Encounter for general adult medical examination with abnormal findings: Secondary | ICD-10-CM | POA: Diagnosis not present

## 2022-07-15 DIAGNOSIS — M25562 Pain in left knee: Secondary | ICD-10-CM | POA: Diagnosis not present

## 2022-08-13 DIAGNOSIS — M6752 Plica syndrome, left knee: Secondary | ICD-10-CM | POA: Diagnosis not present

## 2022-08-13 DIAGNOSIS — Y999 Unspecified external cause status: Secondary | ICD-10-CM | POA: Diagnosis not present

## 2022-08-13 DIAGNOSIS — M94262 Chondromalacia, left knee: Secondary | ICD-10-CM | POA: Diagnosis not present

## 2022-08-13 DIAGNOSIS — S83232A Complex tear of medial meniscus, current injury, left knee, initial encounter: Secondary | ICD-10-CM | POA: Diagnosis not present

## 2022-08-13 DIAGNOSIS — X58XXXA Exposure to other specified factors, initial encounter: Secondary | ICD-10-CM | POA: Diagnosis not present

## 2022-08-13 DIAGNOSIS — G8918 Other acute postprocedural pain: Secondary | ICD-10-CM | POA: Diagnosis not present

## 2022-08-13 DIAGNOSIS — S83272A Complex tear of lateral meniscus, current injury, left knee, initial encounter: Secondary | ICD-10-CM | POA: Diagnosis not present

## 2022-10-16 DIAGNOSIS — M7062 Trochanteric bursitis, left hip: Secondary | ICD-10-CM | POA: Diagnosis not present

## 2022-10-16 DIAGNOSIS — M25562 Pain in left knee: Secondary | ICD-10-CM | POA: Diagnosis not present

## 2022-11-13 DIAGNOSIS — M25562 Pain in left knee: Secondary | ICD-10-CM | POA: Diagnosis not present

## 2022-12-03 DIAGNOSIS — R922 Inconclusive mammogram: Secondary | ICD-10-CM | POA: Diagnosis not present

## 2022-12-03 DIAGNOSIS — R928 Other abnormal and inconclusive findings on diagnostic imaging of breast: Secondary | ICD-10-CM | POA: Diagnosis not present

## 2022-12-03 DIAGNOSIS — R921 Mammographic calcification found on diagnostic imaging of breast: Secondary | ICD-10-CM | POA: Diagnosis not present

## 2023-04-02 DIAGNOSIS — M810 Age-related osteoporosis without current pathological fracture: Secondary | ICD-10-CM | POA: Diagnosis not present

## 2023-04-02 DIAGNOSIS — Z823 Family history of stroke: Secondary | ICD-10-CM | POA: Diagnosis not present

## 2023-04-02 DIAGNOSIS — E785 Hyperlipidemia, unspecified: Secondary | ICD-10-CM | POA: Diagnosis not present

## 2023-04-02 DIAGNOSIS — E669 Obesity, unspecified: Secondary | ICD-10-CM | POA: Diagnosis not present

## 2023-04-02 DIAGNOSIS — Z791 Long term (current) use of non-steroidal anti-inflammatories (NSAID): Secondary | ICD-10-CM | POA: Diagnosis not present

## 2023-04-02 DIAGNOSIS — R011 Cardiac murmur, unspecified: Secondary | ICD-10-CM | POA: Diagnosis not present

## 2023-04-02 DIAGNOSIS — Z8249 Family history of ischemic heart disease and other diseases of the circulatory system: Secondary | ICD-10-CM | POA: Diagnosis not present

## 2023-04-02 DIAGNOSIS — I10 Essential (primary) hypertension: Secondary | ICD-10-CM | POA: Diagnosis not present

## 2023-04-02 DIAGNOSIS — J309 Allergic rhinitis, unspecified: Secondary | ICD-10-CM | POA: Diagnosis not present

## 2023-04-02 DIAGNOSIS — Z809 Family history of malignant neoplasm, unspecified: Secondary | ICD-10-CM | POA: Diagnosis not present

## 2023-04-02 DIAGNOSIS — K219 Gastro-esophageal reflux disease without esophagitis: Secondary | ICD-10-CM | POA: Diagnosis not present

## 2023-04-02 DIAGNOSIS — M1990 Unspecified osteoarthritis, unspecified site: Secondary | ICD-10-CM | POA: Diagnosis not present

## 2023-06-09 DIAGNOSIS — Z23 Encounter for immunization: Secondary | ICD-10-CM | POA: Diagnosis not present

## 2023-06-17 DIAGNOSIS — W01198A Fall on same level from slipping, tripping and stumbling with subsequent striking against other object, initial encounter: Secondary | ICD-10-CM | POA: Diagnosis not present

## 2023-06-17 DIAGNOSIS — R519 Headache, unspecified: Secondary | ICD-10-CM | POA: Diagnosis not present

## 2023-06-17 DIAGNOSIS — M8580 Other specified disorders of bone density and structure, unspecified site: Secondary | ICD-10-CM | POA: Diagnosis not present

## 2023-06-17 DIAGNOSIS — M47812 Spondylosis without myelopathy or radiculopathy, cervical region: Secondary | ICD-10-CM | POA: Diagnosis not present

## 2023-06-17 DIAGNOSIS — Z90710 Acquired absence of both cervix and uterus: Secondary | ICD-10-CM | POA: Diagnosis not present

## 2023-06-17 DIAGNOSIS — S0990XA Unspecified injury of head, initial encounter: Secondary | ICD-10-CM | POA: Diagnosis not present

## 2023-06-17 DIAGNOSIS — S60511A Abrasion of right hand, initial encounter: Secondary | ICD-10-CM | POA: Diagnosis not present

## 2023-06-17 DIAGNOSIS — Y92000 Kitchen of unspecified non-institutional (private) residence as  the place of occurrence of the external cause: Secondary | ICD-10-CM | POA: Diagnosis not present

## 2023-06-17 DIAGNOSIS — M542 Cervicalgia: Secondary | ICD-10-CM | POA: Diagnosis not present

## 2023-06-17 DIAGNOSIS — R220 Localized swelling, mass and lump, head: Secondary | ICD-10-CM | POA: Diagnosis not present

## 2023-06-17 DIAGNOSIS — S63061A Subluxation of metacarpal (bone), proximal end of right hand, initial encounter: Secondary | ICD-10-CM | POA: Diagnosis not present

## 2023-06-17 DIAGNOSIS — W010XXA Fall on same level from slipping, tripping and stumbling without subsequent striking against object, initial encounter: Secondary | ICD-10-CM | POA: Diagnosis not present

## 2023-06-17 NOTE — ED Triage Notes (Signed)
 Pt tripped and fell.  Pt has a laceration on forehead and laceration to right hand

## 2023-07-17 DIAGNOSIS — E7849 Other hyperlipidemia: Secondary | ICD-10-CM | POA: Diagnosis not present

## 2023-07-17 DIAGNOSIS — E0590 Thyrotoxicosis, unspecified without thyrotoxic crisis or storm: Secondary | ICD-10-CM | POA: Diagnosis not present

## 2023-07-17 DIAGNOSIS — R030 Elevated blood-pressure reading, without diagnosis of hypertension: Secondary | ICD-10-CM | POA: Diagnosis not present

## 2023-07-17 DIAGNOSIS — E039 Hypothyroidism, unspecified: Secondary | ICD-10-CM | POA: Diagnosis not present

## 2023-07-17 DIAGNOSIS — Z0001 Encounter for general adult medical examination with abnormal findings: Secondary | ICD-10-CM | POA: Diagnosis not present

## 2023-07-17 DIAGNOSIS — F17210 Nicotine dependence, cigarettes, uncomplicated: Secondary | ICD-10-CM | POA: Diagnosis not present

## 2023-08-06 DIAGNOSIS — E7801 Familial hypercholesterolemia: Secondary | ICD-10-CM | POA: Diagnosis not present

## 2023-08-06 DIAGNOSIS — Z6834 Body mass index (BMI) 34.0-34.9, adult: Secondary | ICD-10-CM | POA: Diagnosis not present

## 2023-08-06 DIAGNOSIS — Z0001 Encounter for general adult medical examination with abnormal findings: Secondary | ICD-10-CM | POA: Diagnosis not present

## 2023-08-06 DIAGNOSIS — Z23 Encounter for immunization: Secondary | ICD-10-CM | POA: Diagnosis not present

## 2023-08-06 DIAGNOSIS — R030 Elevated blood-pressure reading, without diagnosis of hypertension: Secondary | ICD-10-CM | POA: Diagnosis not present

## 2023-08-19 DIAGNOSIS — H40013 Open angle with borderline findings, low risk, bilateral: Secondary | ICD-10-CM | POA: Diagnosis not present

## 2023-08-19 DIAGNOSIS — Z0100 Encounter for examination of eyes and vision without abnormal findings: Secondary | ICD-10-CM | POA: Diagnosis not present

## 2023-08-19 DIAGNOSIS — Z961 Presence of intraocular lens: Secondary | ICD-10-CM | POA: Diagnosis not present

## 2023-12-08 DIAGNOSIS — Z1231 Encounter for screening mammogram for malignant neoplasm of breast: Secondary | ICD-10-CM | POA: Diagnosis not present

## 2024-05-20 DIAGNOSIS — Z23 Encounter for immunization: Secondary | ICD-10-CM | POA: Diagnosis not present

## 2024-08-03 DIAGNOSIS — E7849 Other hyperlipidemia: Secondary | ICD-10-CM | POA: Diagnosis not present

## 2024-08-03 DIAGNOSIS — R7989 Other specified abnormal findings of blood chemistry: Secondary | ICD-10-CM | POA: Diagnosis not present

## 2024-08-03 DIAGNOSIS — E039 Hypothyroidism, unspecified: Secondary | ICD-10-CM | POA: Diagnosis not present

## 2024-08-17 ENCOUNTER — Encounter (INDEPENDENT_AMBULATORY_CARE_PROVIDER_SITE_OTHER): Payer: Self-pay | Admitting: *Deleted

## 2024-09-13 ENCOUNTER — Telehealth: Payer: Self-pay | Admitting: *Deleted

## 2024-09-13 NOTE — Telephone Encounter (Signed)
" °  Procedure: COLONOSCOPY  Height: 5'2 Weight: 181LBS        Have you had a colonoscopy before?  2016, [facility]  Do you have family history of colon cancer?  no  Do you have a family history of polyps? no  Previous colonoscopy with polyps removed? no  Do you have a history colorectal cancer?   no  Are you diabetic?  no  Do you have a prosthetic or mechanical heart valve? no  Do you have a pacemaker/defibrillator?   no  Have you had endocarditis/atrial fibrillation?  no  Do you use supplemental oxygen/CPAP?  no  Have you had joint replacement within the last 12 months?  no  Do you tend to be constipated or have to use laxatives?  no   Do you have history of alcohol  use? If yes, how much and how often.  no  Do you have history or are you using drugs? If yes, what do are you  using?  no  Have you ever had a stroke/heart attack?  no  Have you ever had a heart or other vascular stent placed,?no  Do you take weight loss medication? no  female patients,: have you had a hysterectomy? yes                              are you post menopausal?                                do you still have your menstrual cycle?     Date of last menstrual period?   Do you take any blood-thinning medications such as: (Plavix, aspirin , Coumadin, Aggrenox, Brilinta, Xarelto, Eliquis, Pradaxa, Savaysa or Effient)? no  If yes we need the name, milligram, dosage and who is prescribing doctor:               Current Outpatient Medications  Medication Sig Dispense Refill   omeprazole  (PRILOSEC) 20 MG capsule Take 20 mg by mouth daily with lunch.      No current facility-administered medications for this visit.    Allergies[1]      [1]  Allergies Allergen Reactions   Sulfa Antibiotics Nausea And Vomiting   "

## 2024-09-29 NOTE — Telephone Encounter (Signed)
 Ok to schedule. Asa 3. Rm 1,2 ok.

## 2024-10-04 NOTE — Telephone Encounter (Signed)
 Referral closed
